# Patient Record
Sex: Female | Born: 1970 | Race: Black or African American | Hispanic: No | Marital: Single | Smoking: Never smoker
Health system: Southern US, Community
[De-identification: ages and names within clinical notes are randomized; demographics above are authoritative.]

## PROBLEM LIST (undated history)

## (undated) DIAGNOSIS — I10 Essential (primary) hypertension: Secondary | ICD-10-CM

## (undated) HISTORY — PX: TUBAL LIGATION: SHX77

---

## 1997-09-28 ENCOUNTER — Other Ambulatory Visit: Admission: RE | Admit: 1997-09-28 | Discharge: 1997-09-28 | Payer: Self-pay | Admitting: *Deleted

## 1997-09-28 ENCOUNTER — Ambulatory Visit (HOSPITAL_COMMUNITY): Admission: RE | Admit: 1997-09-28 | Discharge: 1997-09-28 | Payer: Self-pay | Admitting: *Deleted

## 1997-12-03 ENCOUNTER — Ambulatory Visit (HOSPITAL_COMMUNITY): Admission: RE | Admit: 1997-12-03 | Discharge: 1997-12-03 | Payer: Self-pay | Admitting: *Deleted

## 1998-02-03 ENCOUNTER — Inpatient Hospital Stay (HOSPITAL_COMMUNITY): Admission: AD | Admit: 1998-02-03 | Discharge: 1998-02-05 | Payer: Self-pay | Admitting: *Deleted

## 1998-05-02 ENCOUNTER — Inpatient Hospital Stay (HOSPITAL_COMMUNITY): Admission: AD | Admit: 1998-05-02 | Discharge: 1998-05-02 | Payer: Self-pay | Admitting: *Deleted

## 1999-01-14 ENCOUNTER — Encounter: Payer: Self-pay | Admitting: Emergency Medicine

## 1999-01-14 ENCOUNTER — Emergency Department (HOSPITAL_COMMUNITY): Admission: EM | Admit: 1999-01-14 | Discharge: 1999-01-14 | Payer: Self-pay | Admitting: Emergency Medicine

## 1999-07-10 ENCOUNTER — Encounter: Payer: Self-pay | Admitting: Emergency Medicine

## 1999-07-10 ENCOUNTER — Emergency Department (HOSPITAL_COMMUNITY): Admission: EM | Admit: 1999-07-10 | Discharge: 1999-07-10 | Payer: Self-pay | Admitting: Emergency Medicine

## 2000-09-22 ENCOUNTER — Encounter: Payer: Self-pay | Admitting: Emergency Medicine

## 2000-09-22 ENCOUNTER — Emergency Department (HOSPITAL_COMMUNITY): Admission: EM | Admit: 2000-09-22 | Discharge: 2000-09-22 | Payer: Self-pay | Admitting: Emergency Medicine

## 2000-10-18 ENCOUNTER — Emergency Department (HOSPITAL_COMMUNITY): Admission: EM | Admit: 2000-10-18 | Discharge: 2000-10-18 | Payer: Self-pay | Admitting: Emergency Medicine

## 2001-10-11 ENCOUNTER — Emergency Department (HOSPITAL_COMMUNITY): Admission: EM | Admit: 2001-10-11 | Discharge: 2001-10-11 | Payer: Self-pay | Admitting: Emergency Medicine

## 2001-10-11 ENCOUNTER — Encounter: Payer: Self-pay | Admitting: Emergency Medicine

## 2003-06-26 ENCOUNTER — Inpatient Hospital Stay (HOSPITAL_COMMUNITY): Admission: AD | Admit: 2003-06-26 | Discharge: 2003-07-01 | Payer: Self-pay | Admitting: Obstetrics

## 2003-06-26 ENCOUNTER — Encounter (INDEPENDENT_AMBULATORY_CARE_PROVIDER_SITE_OTHER): Payer: Self-pay | Admitting: Specialist

## 2004-03-22 ENCOUNTER — Emergency Department (HOSPITAL_COMMUNITY): Admission: EM | Admit: 2004-03-22 | Discharge: 2004-03-22 | Payer: Self-pay | Admitting: Emergency Medicine

## 2004-09-20 ENCOUNTER — Emergency Department (HOSPITAL_COMMUNITY): Admission: EM | Admit: 2004-09-20 | Discharge: 2004-09-20 | Payer: Self-pay | Admitting: Emergency Medicine

## 2004-12-31 ENCOUNTER — Inpatient Hospital Stay (HOSPITAL_COMMUNITY): Admission: AD | Admit: 2004-12-31 | Discharge: 2004-12-31 | Payer: Self-pay | Admitting: Obstetrics and Gynecology

## 2005-02-27 ENCOUNTER — Emergency Department (HOSPITAL_COMMUNITY): Admission: EM | Admit: 2005-02-27 | Discharge: 2005-02-27 | Payer: Self-pay | Admitting: *Deleted

## 2005-05-05 ENCOUNTER — Ambulatory Visit (HOSPITAL_COMMUNITY): Admission: RE | Admit: 2005-05-05 | Discharge: 2005-05-05 | Payer: Self-pay | Admitting: Obstetrics

## 2005-06-23 ENCOUNTER — Emergency Department (HOSPITAL_COMMUNITY): Admission: EM | Admit: 2005-06-23 | Discharge: 2005-06-23 | Payer: Self-pay | Admitting: Family Medicine

## 2005-07-27 ENCOUNTER — Emergency Department (HOSPITAL_COMMUNITY): Admission: EM | Admit: 2005-07-27 | Discharge: 2005-07-27 | Payer: Self-pay | Admitting: Emergency Medicine

## 2005-12-11 ENCOUNTER — Emergency Department (HOSPITAL_COMMUNITY): Admission: EM | Admit: 2005-12-11 | Discharge: 2005-12-11 | Payer: Self-pay | Admitting: Family Medicine

## 2006-07-02 ENCOUNTER — Inpatient Hospital Stay (HOSPITAL_COMMUNITY): Admission: AD | Admit: 2006-07-02 | Discharge: 2006-07-02 | Payer: Self-pay | Admitting: Obstetrics

## 2006-09-30 ENCOUNTER — Inpatient Hospital Stay (HOSPITAL_COMMUNITY): Admission: AD | Admit: 2006-09-30 | Discharge: 2006-09-30 | Payer: Self-pay | Admitting: Obstetrics

## 2007-09-29 ENCOUNTER — Inpatient Hospital Stay (HOSPITAL_COMMUNITY): Admission: AD | Admit: 2007-09-29 | Discharge: 2007-09-29 | Payer: Self-pay | Admitting: Obstetrics

## 2007-11-29 ENCOUNTER — Ambulatory Visit (HOSPITAL_COMMUNITY): Admission: RE | Admit: 2007-11-29 | Discharge: 2007-11-29 | Payer: Self-pay | Admitting: Obstetrics & Gynecology

## 2008-05-18 ENCOUNTER — Emergency Department (HOSPITAL_COMMUNITY): Admission: EM | Admit: 2008-05-18 | Discharge: 2008-05-18 | Payer: Self-pay | Admitting: Family Medicine

## 2008-06-06 ENCOUNTER — Emergency Department (HOSPITAL_COMMUNITY): Admission: EM | Admit: 2008-06-06 | Discharge: 2008-06-06 | Payer: Self-pay | Admitting: Emergency Medicine

## 2009-08-12 ENCOUNTER — Emergency Department (HOSPITAL_COMMUNITY): Admission: EM | Admit: 2009-08-12 | Discharge: 2009-08-12 | Payer: Self-pay | Admitting: Family Medicine

## 2010-10-18 NOTE — Op Note (Signed)
[name], [name]            ACCOUNT NO.:  [id number]   MEDICAL RECORD NO.:  [id number]          PATIENT TYPE:  AMB   LOCATION:  SDC                           FACILITY:  [facility]   PHYSICIAN:  Roseanna Rainbow, M.D.DATE OF BIRTH:  01-14-1971   DATE OF PROCEDURE:  11/29/2007  DATE OF DISCHARGE:  09/29/2007                               OPERATIVE REPORT   PREOPERATIVE DIAGNOSIS:  Desires sterilization procedure.   POSTOPERATIVE DIAGNOSIS:  Desires sterilization procedure.   PROCEDURE:  Essure and laparoscopic unilateral tubal ligation.   SURGEON:  Roseanna Rainbow, MD.   ANESTHESIA:  General with monitored anesthesia care and paracervical  block.   ESTIMATED BLOOD LOSS:  Minimal.   COMPLICATIONS:  None.   PROCEDURE:  The patient was taken to the operating room with an IV  running.  She was placed in the dorsal lithotomy position and prepped  and draped in the usual sterile fashion.  A bivalve speculum was placed  in the patient's vagina after a time-out had been completed.  The  anterior lip of the cervix was infiltrated with 2 mL 1% Xylocaine plain.  The single-tooth tenaculum was then applied to this location.  Four mL  of 1% Xylocaine plain was infiltrated at 4 o'clock and 7 o'clock to  produce a paracervical block.  The cervix was then dilated with [facility]  dilators.  The hysteroscope was then advanced into the uterus.  Upon  hysteroscopic survey of the cornu, the left tubal ostia was visualized  and it was felt that the right tubal ostia was visualized as well;  however, there was a filmy membrane covering the ostium.  The left  ostium was then cannulated with the Essure device and placed.  After  placement, four portals were noted distal to the ostium.  Attempts were  made to cannulate the right tubal ostium; however, this appears to be  difficult and further efforts were aborted.  At that point, the decision  was made to proceed with laparoscopy.  All the  instruments were removed  from the vagina.  The abdomen and vagina were then reprepped and draped.  General anesthesia was then given.  An infraumbilical skin incision was  then made with the scalpel.  Using the Optiview, a trocar and sleeve  were then advanced into the abdomen.  The abdomen was then infiltrated  with CO2 gas.  Survey of the pelvic viscera revealed normal findings.  A  2-cm segment of the mid isthmic portion of tube was then contiguously  cauterized using the bipolar instrument.  With each application, the  ohmmeter was noted for 0.  All the instruments were then removed from  the abdomen.  The skin was closed with interrupted sutures of 4-0 Vicryl  and Dermabond.  Prior to the laparoscopic portion of the case, a Hulka  manipulator had been advanced into  the uterus and secured to the anterior lip of the cervix to manipulate  the uterus, and this was removed at the end of the case with minimal  bleeding noted.  At the close of the procedure, the instrument and pack  counts were said to be correct x2.  The patient was taken to the PACU  awake and in stable condition.      Roseanna Rainbow, M.D.  Electronically Signed     LAJ/MEDQ  D:  11/29/2007  T:  11/30/2007  Job:  [id number]

## 2010-10-21 NOTE — Op Note (Signed)
[name], [name]                        ACCOUNT NO.:  [id number]   MEDICAL RECORD NO.:  [id number]                   PATIENT TYPE:  INP   LOCATION:  9199                                 FACILITY:  [facility]   PHYSICIAN:  Kathreen Cosier, M.D.           DATE OF BIRTH:  [birth year]   DATE OF PROCEDURE:  06/26/2003  DATE OF DISCHARGE:                                 OPERATIVE REPORT   PREOPERATIVE DIAGNOSIS:  Abruption of the placenta at 29 weeks.   ANESTHESIA:  General.   PROCEDURE:  Patient placed on the operating room table in supine position,  abdomen prepped and draped.  Bladder emptied with a Foley catheter.  A  transverse suprapubic incision made, carried out to the rectus fascia, the  fascia cleaned and incised the length of the incision.  Recti muscles  retracted laterally, peritoneum incised longitudinally.  A transverse  incision made in the visceral peritoneum above the bladder and the bladder  mobilized inferiorly.  A transverse lower uterine incision was made and  delivery effected at 2:11 p.m.  There was a female, Apgar 3 and 8.  The uterus  was full of blood.  There was greater than an 80% abruption of the placenta.  The uterus was also becoming a [place] uterus.  The uterine cavity  cleaned with dry laps.  The uterine incision closed in one layer with  continuous suture of #1 chromic including myometrium and endometrium.  Bladder flap reattached with 2-0 chromic.  The tubes and ovaries were  normal.  Abdomen closed in layers, the peritoneum with a continuous suture  of 0 chromic, the fascia with continuous suture of 0 Dexon, and the skin  closed with subcuticular stitch of 4-0 Monocryl.  Cord pH was 6.92 and the  baby weighed 3 pounds 1 ounce.  Blood loss was approximately 700 mL.                                               Kathreen Cosier, M.D.    BAM/MEDQ  D:  06/26/2003  T:  06/27/2003  Job:  [id number]

## 2010-10-21 NOTE — Discharge Summary (Signed)
[name], [name]                        ACCOUNT NO.:  [id number]   MEDICAL RECORD NO.:  [id number]                   PATIENT TYPE:  INP   LOCATION:  9307                                 FACILITY:  [facility]   PHYSICIAN:  Kathreen Cosier, M.D.           DATE OF BIRTH:  [birth year]   DATE OF ADMISSION:  06/26/2003  DATE OF DISCHARGE:  07/01/2003                                 DISCHARGE SUMMARY   The patient is a 40 year old, gravida 5, para 3-1-1-4, whose EDC was September 05, 2003, by recent ultrasound. The patient was first seen in September and  made a total of two office visits. She was last seen on October 15th in my  office. Since then there has been no contact. The patient was admitted with  sharp lower abdominal pain which began at 11:30 a.m. and she came to the  hospital at 1:30 p.m. On admission her blood pressure was 147/105, cervix 2  cm, and the uterus was rigid. Fetal heart rate was between 116 and 120.  Hemoglobin was 9.6, platelet count 155,000.  With a diagnosis of hidden  abruption, she was taken to the operating room and primary low transverse  Cesarean section was performed.  She had a female, Apgar of 3 and 6, placenta  was greater than 80% abrupted. Blood loss was at least 700 cc. Tubal  ligation was not performed. Cord pH was 6.92. It was noted when the patient  was in the recovery room lochia was heavier than normal. She was given  methadone 0.2 IM, but still was bleeding. So Hemabate 250 mcg was given. At  5:45 p.m., her hemoglobin was 6.4, platelet count 131,000. Her DIC panel  showed her PT at 18.5, INR 9.1, PTT 35, platelet count 135,000. She  continued bleeding. She got two units of fresh frozen plasma and a second  dose of Humibid IM, and Cytotec 800 mcg was given per rectum. She was  started on transfusion.  After receiving the Cytotec, Humibid, and the fresh  frozen plasma, her bleeding improved, and she was transferred to the ICU.  On June 27, 2003, her  blood pressure was 110/60, pulse 90.  Hemoglobin  8.9 after three units of packed cells. Her output had improved after given  Lasix 40 mg. On the afternoon of June 26, 2002, her output decreased and  a central line was placed by anesthesia. Initial CVP was 5-6. She was then  hydrated with D-5 ringers Lactate at 125 cc/hour, and her output improved.  Chest x-ray was normal. She was given an additional two units of packed  cells for a  total of five units of packed cells.  By the second  postoperative day, June 28, 2003, blood pressure was 150/80, pulse 100,  oxygen saturation 94%. Chest x-ray was negative. Hemoglobin 7.9 after five  units, white count 21.8, platelet count 70,000. D-dimer 4.7, fibrinogen 480;  up from 78 initially and  1.3. Outpatient became good. Her potassium was 3.4.  She was placed on K-Dur. Her incision was clean and dry. By the third  postoperative day, platelet count was 89,000, DIC panel normal, hemoglobin  7.4, abdomen soft, incision clean. The CVP was discontinued and the patient  was transferred over to ICU. After transfer, her blood pressure ranged  between 140 to 91 over 60 to 100.  Digoxin level was negative. She was  started on hydrochlorothiazide on day four and labetalol 100 mg p.o. b.i.d.  By day five, blood pressure ranged between 140 to 150 over 90 to 100.  Hemoglobin was 7.3. Digoxin level was negative.  It was decided she could be  discharged.  She felt fine, had no complaints. Page instructions were given  to the patient and her uncle to see Dr. Shana Chute within three days of  discharge. The telephone number of Dr. Shana Chute is given for follow up of her  blood pressure. She is given ferrous sulfate 325 mg p.o. b.i.d.,  hydrochlorothiazide 50 m p.o. daily, labetalol 100 mg p.o. b.i.d., Tylenol  #3 for pain, and to see me in six weeks and Dr. Shana Chute in three days.   DISCHARGE DIAGNOSES:  1. Status post limited prenatal care.  2. Abruption of the  placenta at 29 weeks.                                               Kathreen Cosier, M.D.   BAM/MEDQ  D:  07/01/2003  T:  07/01/2003  Job:  [id number]

## 2010-10-21 NOTE — H&P (Signed)
[name], [name]                        ACCOUNT NO.:  [id number]   MEDICAL RECORD NO.:  [id number]                   PATIENT TYPE:  INP   LOCATION:  9199                                 FACILITY:  [facility]   PHYSICIAN:  Kathreen Cosier, M.D.           DATE OF BIRTH:  11-22-1970   DATE OF ADMISSION:  06/26/2003  DATE OF DISCHARGE:                                HISTORY & PHYSICAL   The patient is a 40 year old gravida 5 para 3-1-1-4, [facility] April 2005, came to  the hospital with a sudden onset of sharp abdominal pain which began at  11:30 a.m. the day of admission and the pain was unremitting and got worse.  The patient arrived in triage at 1:30 p.m.  She was placed on a monitor at  1:32.  Because of difficulty finding the heart beat, the nurse called and  said that the patient was having abruption.  Her abdomen was rigid.  I was  in the hospital.  I went and saw the patient and confirmed the diagnosis at  approximately 1:34 p.m.  C-section was called and the cervix was 2-cm.  Arrived in the OR at 1:55 p.m.  A quick spinal was done but it did not work.  Fetal heart was 99.  After the spinal general anesthesia was administered  for surgery.   PHYSICAL EXAMINATION:  GENERAL:  Revealed a well developed female in no  distress complaining of severe abdominal pain.  HEENT:  Negative.  LUNGS:  Clear.  HEART:  Regular rhythm.  No murmurs, no gallops.  ABDOMEN:  Is 30 week size and rock hard and tender with no relaxation.  PELVIC:  Cervix was 2-cm __________ .  EXTREMITIES:  Negative.                                               Kathreen Cosier, M.D.    BAM/MEDQ  D:  06/26/2003  T:  06/27/2003  Job:  [id number]

## 2011-02-28 LAB — HCG, QUANTITATIVE, PREGNANCY: hCG, Beta Chain, Quant, S: 2580 — ABNORMAL HIGH

## 2011-02-28 LAB — CBC
HCT: 32.5 — ABNORMAL LOW
Hemoglobin: 11.3 — ABNORMAL LOW
MCHC: 34.9
MCV: 99.2
RBC: 3.28 — ABNORMAL LOW

## 2011-02-28 LAB — GC/CHLAMYDIA PROBE AMP, GENITAL
Chlamydia, DNA Probe: NEGATIVE
GC Probe Amp, Genital: NEGATIVE

## 2011-02-28 LAB — WET PREP, GENITAL: Trich, Wet Prep: NONE SEEN

## 2011-03-02 LAB — BASIC METABOLIC PANEL
Chloride: 106
Creatinine, Ser: 0.96
GFR calc Af Amer: >60
Glucose, Bld: 101 — ABNORMAL HIGH
Potassium: 3.6
Sodium: 137

## 2011-03-02 LAB — CBC
MCHC: 33.8
RBC: 3.7 — ABNORMAL LOW
WBC: 7.7

## 2011-03-02 LAB — PREGNANCY, URINE: Preg Test, Ur: NEGATIVE

## 2011-04-29 ENCOUNTER — Emergency Department (HOSPITAL_COMMUNITY)
Admission: EM | Admit: 2011-04-29 | Discharge: 2011-04-29 | Disposition: A | Discharge status: Home / Self Care | Payer: Self-pay | Attending: Emergency Medicine | Admitting: Emergency Medicine

## 2011-04-29 ENCOUNTER — Encounter: Payer: Self-pay | Admitting: Nurse Practitioner

## 2011-04-29 ENCOUNTER — Emergency Department (HOSPITAL_COMMUNITY): Payer: Self-pay

## 2011-04-29 DIAGNOSIS — I10 Essential (primary) hypertension: Secondary | ICD-10-CM | POA: Insufficient documentation

## 2011-04-29 DIAGNOSIS — R059 Cough, unspecified: Secondary | ICD-10-CM | POA: Insufficient documentation

## 2011-04-29 DIAGNOSIS — R197 Diarrhea, unspecified: Secondary | ICD-10-CM | POA: Insufficient documentation

## 2011-04-29 DIAGNOSIS — R05 Cough: Secondary | ICD-10-CM

## 2011-04-29 DIAGNOSIS — R110 Nausea: Secondary | ICD-10-CM | POA: Insufficient documentation

## 2011-04-29 DIAGNOSIS — Z79899 Other long term (current) drug therapy: Secondary | ICD-10-CM | POA: Insufficient documentation

## 2011-04-29 DIAGNOSIS — IMO0001 Reserved for inherently not codable concepts without codable children: Secondary | ICD-10-CM | POA: Insufficient documentation

## 2011-04-29 DIAGNOSIS — M546 Pain in thoracic spine: Secondary | ICD-10-CM | POA: Insufficient documentation

## 2011-04-29 DIAGNOSIS — R109 Unspecified abdominal pain: Secondary | ICD-10-CM | POA: Insufficient documentation

## 2011-04-29 DIAGNOSIS — R6889 Other general symptoms and signs: Secondary | ICD-10-CM | POA: Insufficient documentation

## 2011-04-29 HISTORY — DX: Essential (primary) hypertension: I10

## 2011-04-29 LAB — DIFFERENTIAL
Basophils Absolute: 0 10*3/uL (ref 0.0–0.1)
Eosinophils Absolute: 0.1 10*3/uL (ref 0.0–0.7)
Lymphocytes Relative: 29 % (ref 12–46)
Monocytes Absolute: 0.7 10*3/uL (ref 0.1–1.0)
Monocytes Relative: 10 % (ref 3–12)
Neutrophils Relative %: 60 % (ref 43–77)

## 2011-04-29 LAB — URINALYSIS, ROUTINE W REFLEX MICROSCOPIC
Bilirubin Urine: NEGATIVE
Ketones, ur: NEGATIVE mg/dL
Nitrite: NEGATIVE
Protein, ur: >300 mg/dL — AB
Urobilinogen, UA: 0.2 mg/dL (ref 0.0–1.0)

## 2011-04-29 LAB — CBC
HCT: 38.4 % (ref 36.0–46.0)
MCV: 97.2 fL (ref 78.0–100.0)
Platelets: 228 10*3/uL (ref 150–400)
RDW: 13.2 % (ref 11.5–15.5)

## 2011-04-29 LAB — URINE MICROSCOPIC-ADD ON

## 2011-04-29 LAB — POCT PREGNANCY, URINE: Preg Test, Ur: NEGATIVE

## 2011-04-29 LAB — COMPREHENSIVE METABOLIC PANEL
ALT: 14 U/L (ref 0–35)
BUN: 8 mg/dL (ref 6–23)
CO2: 26 mEq/L (ref 19–32)
Creatinine, Ser: 0.84 mg/dL (ref 0.50–1.10)
GFR calc Af Amer: >90 mL/min (ref >90)
GFR calc non Af Amer: 85 mL/min — ABNORMAL LOW (ref >90)
Glucose, Bld: 100 mg/dL — ABNORMAL HIGH (ref 70–99)
Sodium: 138 mEq/L (ref 135–145)
Total Bilirubin: 0.1 mg/dL — ABNORMAL LOW (ref 0.3–1.2)
Total Protein: 7.9 g/dL (ref 6.0–8.3)

## 2011-04-29 LAB — LIPASE, BLOOD: Lipase: 32 U/L (ref 11–59)

## 2011-04-29 MED ORDER — BENAZEPRIL-HYDROCHLOROTHIAZIDE 10-12.5 MG PO TABS: 1.0000 | ORAL_TABLET | Freq: Every day | ORAL | Status: DC

## 2011-04-29 MED ORDER — POTASSIUM CHLORIDE 20 MEQ/15ML (10%) PO LIQD
60.0000 meq | Freq: Once | ORAL | Status: AC
  Administered 2011-04-29: 60 meq via ORAL
  Filled 2011-04-29: qty 45

## 2011-04-29 MED ORDER — KETOROLAC TROMETHAMINE 30 MG/ML IJ SOLN
30.0000 mg | Freq: Once | INTRAMUSCULAR | Status: AC
  Administered 2011-04-29: 30 mg via INTRAMUSCULAR
  Filled 2011-04-29: qty 1

## 2011-04-29 NOTE — ED Provider Notes (Addendum)
History     CSN: [id number] Arrival date & time: 04/29/2011  4:33 PM   First MD Initiated Contact with Patient 04/29/11 1726      Chief Complaint  Patient presents with  . Flank Pain     HPI  40 -year-old female history of hypertension noncompliant with medication presents with multiple complaints. The patient complains of left flank, upper back pain, diffuse body aches, nausea for the past 4 days. She is experiencing productive cough with green sputum. Feels like she has pneumonia. She states that 5 days ago she had diarrhea. She's not had diarrhea since then. She denies any blood in her stool. She denies vomiting. No fevers, chills. She has a mild runny nose. No sick contacts. She denies shortness of breath. She denies any difficulty with urination. She states she is not taking any medication for her hypertension in several weeks.    Denies h/o VTE in self or family. No recent hosp/surg/immob. No h/o cancer. Denies exogenous hormone use, no leg pain or swelling Denies hematuria/dysuria/freq/urgency Denies vaginal discharge.    Past Medical History  Diagnosis Date  . Hypertension     History reviewed. No pertinent past surgical history.  History reviewed. No pertinent family history.  History  Substance Use Topics  . Smoking status: Never Smoker   . Smokeless tobacco: Not on file  . Alcohol Use: No    OB History    Grav Para Term Preterm Abortions TAB SAB Ect Mult Living                  Review of Systems  All other systems reviewed and are negative.   except as noted HPI  Allergies  Review of patient's allergies indicates no known allergies.  Home Medications   Current Outpatient Rx  Name Route Sig Dispense Refill  . THERA M PLUS PO TABS Oral Take 1 tablet by mouth daily.      Marland Kitchen PHENIRAMINE-PE-APAP 20-10-650 MG PO PACK Oral Take 1 packet by mouth every 6 (six) hours as needed.      Marland Kitchen BENAZEPRIL-HYDROCHLOROTHIAZIDE 10-12.5 MG PO TABS Oral Take 1 tablet by  mouth daily. 30 tablet 0   04/29/11 1637 98.8 (37.1) 100 16 ! 142/110 98 146 lb (66.225 kg) ASR  BP 142/106  Pulse 90  Temp(Src) 98.5 F (36.9 C) (Oral)  Resp 14  Ht 5\' 6"  (1.676 m)  Wt 146 lb (66.225 kg)  BMI 23.56 kg/m2  SpO2 100%  Physical Exam  Nursing note and vitals reviewed. Constitutional: She is oriented to person, place, and time. She appears well-developed.  HENT:  Head: Atraumatic.  Mouth/Throat: Oropharynx is clear and moist.  Eyes: Conjunctivae and EOM are normal. Pupils are equal, round, and reactive to light.  Neck: Normal range of motion. Neck supple.  Cardiovascular: Normal rate, regular rhythm, normal heart sounds and intact distal pulses.   Pulmonary/Chest: Effort normal and breath sounds normal. No respiratory distress. She has no wheezes. She has no rales.       L upper thoracic paraspinal ttp  Abdominal: Soft. She exhibits no distension. There is no tenderness. There is no rebound and no guarding.       No cvat  Musculoskeletal: Normal range of motion.  Neurological: She is alert and oriented to person, place, and time.  Skin: Skin is warm and dry. No rash noted.  Psychiatric: She has a normal mood and affect.    ED Course  Procedures (including critical care time)  Labs  Reviewed  URINALYSIS, ROUTINE W REFLEX MICROSCOPIC - Abnormal; Notable for the following:    Appearance CLOUDY (*)    Hgb urine dipstick MODERATE (*)    Protein, ur >300 (*)    All other components within normal limits  COMPREHENSIVE METABOLIC PANEL - Abnormal; Notable for the following:    Potassium 2.9 (*)    Glucose, Bld 100 (*)    Albumin 3.4 (*)    Total Bilirubin 0.1 (*)    GFR calc non Af Amer 85 (*)    All other components within normal limits  URINE MICROSCOPIC-ADD ON - Abnormal; Notable for the following:    Squamous Epithelial / LPF MANY (*)    All other components within normal limits  POCT PREGNANCY, URINE  CBC  DIFFERENTIAL  LIPASE, BLOOD   Dg Chest 2  View  04/29/2011  *RADIOLOGY REPORT*  Clinical Data: Cough.  Fever.  CHEST - 2 VIEW  Comparison: None.  Findings:  Cardiopericardial silhouette within normal limits. Mediastinal contours normal. Trachea midline.  No airspace disease or effusion.  IMPRESSION: No active cardiopulmonary disease.  Original Report Authenticated By: Andreas Newport, M.D.     1. Flank pain   2. Diarrhea   3. Cough   4. Hypertension      MDM  Multiple complaints. Pt with 0/10 pain after Toradol. She states that she is willing to take antihypertensives cannot remember her previous medication. She does have some protein in her urine and hematuria but her renal function is normal. Her pain is not intermittent/colicky or c/w renal stone. Her sx are mostly URI/viral syndrome. She is aware that her blood pressure is elevated today. She is given a primary care referral and she will follow for recheck of her blood pressure in 2 days. Given strict precautions for return. She would like to be discharged home.    Forbes Cellar, MD 04/29/11 1610  Forbes Cellar, MD 04/29/11 1958

## 2011-04-29 NOTE — ED Notes (Signed)
C/o flank and upper back pain, body aches, nausea, diarrhea and productive cough over past 4 days. A&Ox4, resp e/u

## 2011-04-29 NOTE — ED Notes (Signed)
Discharge instructions reviewed with Pt;  Verbalizes understanding.  No questions asked ; no further c/o's voiced.  Pt ambulatory to lobby.  NAD noted.  VSS.

## 2011-08-20 ENCOUNTER — Emergency Department (HOSPITAL_COMMUNITY)
Admission: EM | Admit: 2011-08-20 | Discharge: 2011-08-21 | Disposition: A | Discharge status: Home / Self Care | Payer: Self-pay | Attending: Emergency Medicine | Admitting: Emergency Medicine

## 2011-08-20 ENCOUNTER — Encounter (HOSPITAL_COMMUNITY): Payer: Self-pay | Admitting: *Deleted

## 2011-08-20 DIAGNOSIS — M545 Low back pain, unspecified: Secondary | ICD-10-CM | POA: Insufficient documentation

## 2011-08-20 DIAGNOSIS — S39012A Strain of muscle, fascia and tendon of lower back, initial encounter: Secondary | ICD-10-CM

## 2011-08-20 DIAGNOSIS — S139XXA Sprain of joints and ligaments of unspecified parts of neck, initial encounter: Secondary | ICD-10-CM | POA: Insufficient documentation

## 2011-08-20 DIAGNOSIS — Y92410 Unspecified street and highway as the place of occurrence of the external cause: Secondary | ICD-10-CM | POA: Insufficient documentation

## 2011-08-20 DIAGNOSIS — S161XXA Strain of muscle, fascia and tendon at neck level, initial encounter: Secondary | ICD-10-CM

## 2011-08-20 DIAGNOSIS — S339XXA Sprain of unspecified parts of lumbar spine and pelvis, initial encounter: Secondary | ICD-10-CM | POA: Insufficient documentation

## 2011-08-20 DIAGNOSIS — I10 Essential (primary) hypertension: Secondary | ICD-10-CM | POA: Insufficient documentation
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
## 2011-08-21 ENCOUNTER — Emergency Department (HOSPITAL_COMMUNITY): Payer: Self-pay

## 2011-08-21 LAB — POCT PREGNANCY, URINE: Preg Test, Ur: NEGATIVE

## 2011-08-21 MED ORDER — HYDROCODONE-ACETAMINOPHEN 5-325 MG PO TABS: 1.0000 | ORAL_TABLET | ORAL | Status: AC | PRN

## 2011-08-21 MED ORDER — DIAZEPAM 5 MG PO TABS: 5.0000 mg | ORAL_TABLET | Freq: Three times a day (TID) | ORAL | Status: AC | PRN

## 2011-08-21 MED ORDER — IBUPROFEN 800 MG PO TABS
800.0000 mg | ORAL_TABLET | Freq: Once | ORAL | Status: AC
  Administered 2011-08-21: 800 mg via ORAL
  Filled 2011-08-21: qty 1

## 2011-08-21 MED ORDER — IBUPROFEN 400 MG PO TABS: 400.0000 mg | ORAL_TABLET | Freq: Four times a day (QID) | ORAL | Status: AC | PRN
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
## 2011-08-21 NOTE — ED Notes (Signed)
Returned from Radiology.  Denies Numbness or tingling in extremeties, denies urinary/fecal incontinence.
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
## 2011-08-21 NOTE — ED Provider Notes (Signed)
Medical screening examination/treatment/procedure(s) were performed by non-physician practitioner and as supervising physician I was immediately available for consultation/collaboration.   Joya Gaskins, MD 08/21/11 [phone]

## 2011-08-21 NOTE — ED Notes (Signed)
Transported to Radiology.  Snacks and drink given to son
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
## 2013-04-14 ENCOUNTER — Observation Stay (HOSPITAL_COMMUNITY)
Admission: EM | Admit: 2013-04-14 | Discharge: 2013-04-15 | Disposition: A | Discharge status: Home / Self Care | Payer: No Typology Code available for payment source | Attending: Internal Medicine | Admitting: Internal Medicine

## 2013-04-14 ENCOUNTER — Emergency Department (HOSPITAL_COMMUNITY): Payer: No Typology Code available for payment source

## 2013-04-14 ENCOUNTER — Encounter (HOSPITAL_COMMUNITY): Payer: Self-pay | Admitting: Emergency Medicine

## 2013-04-14 DIAGNOSIS — Z23 Encounter for immunization: Secondary | ICD-10-CM | POA: Insufficient documentation

## 2013-04-14 DIAGNOSIS — I160 Hypertensive urgency: Secondary | ICD-10-CM

## 2013-04-14 DIAGNOSIS — I10 Essential (primary) hypertension: Secondary | ICD-10-CM | POA: Diagnosis present

## 2013-04-14 DIAGNOSIS — Y92410 Unspecified street and highway as the place of occurrence of the external cause: Secondary | ICD-10-CM | POA: Insufficient documentation

## 2013-04-14 DIAGNOSIS — Y9389 Activity, other specified: Secondary | ICD-10-CM | POA: Insufficient documentation

## 2013-04-14 DIAGNOSIS — Z79899 Other long term (current) drug therapy: Secondary | ICD-10-CM | POA: Insufficient documentation

## 2013-04-14 DIAGNOSIS — Z3202 Encounter for pregnancy test, result negative: Secondary | ICD-10-CM | POA: Insufficient documentation

## 2013-04-14 DIAGNOSIS — R0602 Shortness of breath: Secondary | ICD-10-CM | POA: Insufficient documentation

## 2013-04-14 DIAGNOSIS — M542 Cervicalgia: Secondary | ICD-10-CM

## 2013-04-14 DIAGNOSIS — S0993XA Unspecified injury of face, initial encounter: Principal | ICD-10-CM | POA: Insufficient documentation

## 2013-04-14 LAB — CBC WITH DIFFERENTIAL/PLATELET
Basophils Absolute: 0 10*3/uL (ref 0.0–0.1)
Basophils Relative: 0 % (ref 0–1)
Eosinophils Absolute: 0.6 10*3/uL (ref 0.0–0.7)
HCT: 36 % (ref 36.0–46.0)
Hemoglobin: 12.1 g/dL (ref 12.0–15.0)
MCH: 33.1 pg (ref 26.0–34.0)
MCHC: 33.6 g/dL (ref 30.0–36.0)
Monocytes Absolute: 0.5 10*3/uL (ref 0.1–1.0)
Monocytes Relative: 7 % (ref 3–12)
Neutro Abs: 2.1 10*3/uL (ref 1.7–7.7)
RDW: 13.3 % (ref 11.5–15.5)

## 2013-04-14 LAB — URINALYSIS, ROUTINE W REFLEX MICROSCOPIC
Bilirubin Urine: NEGATIVE
Ketones, ur: NEGATIVE mg/dL
Leukocytes, UA: NEGATIVE
Nitrite: NEGATIVE
Protein, ur: 30 mg/dL — AB
Urobilinogen, UA: 0.2 mg/dL (ref 0.0–1.0)

## 2013-04-14 LAB — BASIC METABOLIC PANEL
BUN: 18 mg/dL (ref 6–23)
Calcium: 9.5 mg/dL (ref 8.4–10.5)
Creatinine, Ser: 0.97 mg/dL (ref 0.50–1.10)
GFR calc Af Amer: 82 mL/min — ABNORMAL LOW (ref >90)
GFR calc non Af Amer: 71 mL/min — ABNORMAL LOW (ref >90)

## 2013-04-14 LAB — URINE MICROSCOPIC-ADD ON

## 2013-04-14 MED ORDER — AMLODIPINE BESYLATE 2.5 MG PO TABS: 2.5000 mg | ORAL_TABLET | Freq: Every day | ORAL | Status: DC

## 2013-04-14 MED ORDER — CLONIDINE HCL 0.1 MG PO TABS
0.1000 mg | ORAL_TABLET | Freq: Once | ORAL | Status: AC
  Administered 2013-04-14: 0.1 mg via ORAL
  Filled 2013-04-14: qty 1

## 2013-04-14 MED ORDER — HYDROCHLOROTHIAZIDE 12.5 MG PO TABS: 12.5000 mg | ORAL_TABLET | Freq: Every day | ORAL | Status: DC

## 2013-04-14 MED ORDER — HYDROCODONE-ACETAMINOPHEN 5-325 MG PO TABS
2.0000 | ORAL_TABLET | Freq: Once | ORAL | Status: AC
  Administered 2013-04-14: 2 via ORAL
  Filled 2013-04-14: qty 2

## 2013-04-14 MED ORDER — HYDROCODONE-ACETAMINOPHEN 5-325 MG PO TABS: ORAL_TABLET | ORAL | Status: DC
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
## 2013-04-15 ENCOUNTER — Encounter (HOSPITAL_COMMUNITY): Payer: Self-pay | Admitting: Internal Medicine

## 2013-04-15 DIAGNOSIS — I10 Essential (primary) hypertension: Secondary | ICD-10-CM

## 2013-04-15 DIAGNOSIS — M542 Cervicalgia: Secondary | ICD-10-CM

## 2013-04-15 LAB — CBC
HCT: 37.3 % (ref 36.0–46.0)
Hemoglobin: 12.6 g/dL (ref 12.0–15.0)
MCH: 33.2 pg (ref 26.0–34.0)
MCHC: 33.8 g/dL (ref 30.0–36.0)
MCV: 98.4 fL (ref 78.0–100.0)
Platelets: 267 K/uL (ref 150–400)
RBC: 3.79 MIL/uL — ABNORMAL LOW (ref 3.87–5.11)
RDW: 13.4 % (ref 11.5–15.5)
WBC: 6.4 K/uL (ref 4.0–10.5)

## 2013-04-15 LAB — BASIC METABOLIC PANEL
BUN: 16 mg/dL (ref 6–23)
Calcium: 9.3 mg/dL (ref 8.4–10.5)
Chloride: 104 mEq/L (ref 96–112)
Creatinine, Ser: 1.05 mg/dL (ref 0.50–1.10)
GFR calc Af Amer: 74 mL/min — ABNORMAL LOW (ref >90)
Glucose, Bld: 101 mg/dL — ABNORMAL HIGH (ref 70–99)
Potassium: 3.3 mEq/L — ABNORMAL LOW (ref 3.5–5.1)

## 2013-04-15 MED ORDER — HYDROCODONE-ACETAMINOPHEN 5-325 MG PO TABS: 1.0000 | ORAL_TABLET | ORAL | Status: DC | PRN

## 2013-04-15 MED ORDER — INFLUENZA VAC SPLIT QUAD 0.5 ML IM SUSP: 0.5000 mL | INTRAMUSCULAR | Status: DC

## 2013-04-15 MED ORDER — INFLUENZA VAC SPLIT QUAD 0.5 ML IM SUSP
0.5000 mL | INTRAMUSCULAR | Status: AC
  Administered 2013-04-15: 0.5 mL via INTRAMUSCULAR
  Filled 2013-04-15: qty 0.5

## 2013-04-15 MED ORDER — CYCLOBENZAPRINE HCL 10 MG PO TABS: 10.0000 mg | ORAL_TABLET | Freq: Three times a day (TID) | ORAL | Status: DC | PRN

## 2013-04-15 MED ORDER — HYDRALAZINE HCL 20 MG/ML IJ SOLN
10.0000 mg | Freq: Once | INTRAMUSCULAR | Status: AC
  Administered 2013-04-15: 10 mg via INTRAVENOUS
  Filled 2013-04-15: qty 0.5

## 2013-04-15 MED ORDER — BENAZEPRIL-HYDROCHLOROTHIAZIDE 10-12.5 MG PO TABS: 1.0000 | ORAL_TABLET | Freq: Every day | ORAL | Status: DC

## 2013-04-15 MED ORDER — ACETAMINOPHEN 650 MG RE SUPP: 650.0000 mg | Freq: Four times a day (QID) | RECTAL | Status: DC | PRN

## 2013-04-15 MED ORDER — POTASSIUM CHLORIDE CRYS ER 20 MEQ PO TBCR
40.0000 meq | EXTENDED_RELEASE_TABLET | Freq: Once | ORAL | Status: AC
  Administered 2013-04-15: 40 meq via ORAL
  Filled 2013-04-15: qty 2

## 2013-04-15 MED ORDER — HYDRALAZINE HCL 20 MG/ML IJ SOLN: 10.0000 mg | INTRAMUSCULAR | Status: DC | PRN

## 2013-04-15 MED ORDER — IBUPROFEN 800 MG PO TABS
800.0000 mg | ORAL_TABLET | Freq: Three times a day (TID) | ORAL | Status: DC
  Administered 2013-04-15 (×2): 800 mg via ORAL
  Filled 2013-04-15 (×4): qty 1

## 2013-04-15 MED ORDER — HYDROCHLOROTHIAZIDE 12.5 MG PO CAPS
12.5000 mg | ORAL_CAPSULE | Freq: Every day | ORAL | Status: DC
  Administered 2013-04-15: 12.5 mg via ORAL
  Filled 2013-04-15: qty 1

## 2013-04-15 MED ORDER — LISINOPRIL-HYDROCHLOROTHIAZIDE 20-12.5 MG PO TABS: 1.0000 | ORAL_TABLET | Freq: Every day | ORAL | Status: DC

## 2013-04-15 MED ORDER — SODIUM CHLORIDE 0.9 % IJ SOLN: 3.0000 mL | Freq: Two times a day (BID) | INTRAMUSCULAR | Status: DC

## 2013-04-15 MED ORDER — ACETAMINOPHEN 325 MG PO TABS: 650.0000 mg | ORAL_TABLET | Freq: Four times a day (QID) | ORAL | Status: DC | PRN

## 2013-04-15 MED ORDER — ONDANSETRON HCL 4 MG/2ML IJ SOLN: 4.0000 mg | Freq: Three times a day (TID) | INTRAMUSCULAR | Status: DC | PRN

## 2013-04-15 MED ORDER — CYCLOBENZAPRINE HCL 10 MG PO TABS
10.0000 mg | ORAL_TABLET | Freq: Three times a day (TID) | ORAL | Status: DC | PRN
  Filled 2013-04-15: qty 1

## 2013-04-15 MED ORDER — ONDANSETRON HCL 4 MG/2ML IJ SOLN: 4.0000 mg | Freq: Four times a day (QID) | INTRAMUSCULAR | Status: DC | PRN

## 2013-04-15 MED ORDER — BENAZEPRIL HCL 10 MG PO TABS
10.0000 mg | ORAL_TABLET | Freq: Every day | ORAL | Status: DC
  Administered 2013-04-15: 10 mg via ORAL
  Filled 2013-04-15: qty 1

## 2013-04-15 MED ORDER — ONDANSETRON HCL 4 MG PO TABS: 4.0000 mg | ORAL_TABLET | Freq: Four times a day (QID) | ORAL | Status: DC | PRN

## 2013-04-15 MED ORDER — HYDRALAZINE HCL 20 MG/ML IJ SOLN
2.0000 mg | Freq: Once | INTRAMUSCULAR | Status: DC
  Filled 2013-04-15: qty 0.1
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
## 2013-04-15 NOTE — ED Notes (Addendum)
PA notified of d/c vitals, high bp

## 2013-04-15 NOTE — Care Management (Addendum)
1133 04-15-13 Pt has f/u appointment scheduled 04-22-13 @ 2pm. Pt will f/u with orange card on same day for medication assistance. She has the eligibility paperwork and is aware of items needed for appointment.  Pt will be d/c on $4.00 meds that can be purchased at [facility]. No furthter needs from CM at this time. Gala Lewandowsky, RN,BSN [phone]

## 2013-04-16 ENCOUNTER — Telehealth (HOSPITAL_BASED_OUTPATIENT_CLINIC_OR_DEPARTMENT_OTHER): Payer: Self-pay

## 2013-04-17 NOTE — ED Provider Notes (Signed)
Medical screening examination/treatment/procedure(s) were performed by non-physician practitioner and as supervising physician I was immediately available for consultation/collaboration.  EKG Interpretation     Ventricular Rate:  61 PR Interval:  177 QRS Duration: 83 QT Interval:  464 QTC Calculation: 467 R Axis:   33 Text Interpretation:  Sinus rhythm Probable left atrial enlargement             Harrold Donath R. Rubin Payor, MD 04/17/13 [phone]

## 2013-04-22 ENCOUNTER — Ambulatory Visit: Payer: Self-pay | Attending: Internal Medicine | Admitting: Internal Medicine

## 2013-04-22 ENCOUNTER — Encounter: Payer: Self-pay | Admitting: Internal Medicine

## 2013-04-22 VITALS — BP 168/113 | HR 79 | Temp 98.9°F | Ht 67.0 in | Wt 142.0 lb

## 2013-04-22 DIAGNOSIS — I10 Essential (primary) hypertension: Secondary | ICD-10-CM

## 2013-04-22 LAB — BASIC METABOLIC PANEL
CO2: 30 mEq/L (ref 19–32)
Calcium: 9.9 mg/dL (ref 8.4–10.5)
Creat: 0.92 mg/dL (ref 0.50–1.10)
Sodium: 138 mEq/L (ref 135–145)

## 2013-04-22 MED ORDER — CLONIDINE HCL 0.1 MG PO TABS
0.1000 mg | ORAL_TABLET | Freq: Once | ORAL | Status: AC
  Administered 2013-04-22: 0.1 mg via ORAL

## 2013-04-22 NOTE — Patient Instructions (Signed)
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
## 2013-04-23 LAB — ALDOSTERONE + RENIN ACTIVITY W/ RATIO

## 2013-04-27 LAB — ALDOSTERONE: Aldosterone, Serum: 1 ng/dL

## 2013-05-21 ENCOUNTER — Telehealth: Payer: Self-pay | Admitting: *Deleted

## 2013-05-21 NOTE — Telephone Encounter (Signed)
Spoke to pt and informed her that her lab results came back normal.

## 2013-05-26 ENCOUNTER — Ambulatory Visit: Payer: Self-pay | Admitting: Internal Medicine

## 2013-10-03 ENCOUNTER — Ambulatory Visit: Payer: Self-pay | Admitting: Family Medicine

## 2013-10-03 ENCOUNTER — Telehealth: Payer: Self-pay | Admitting: *Deleted

## 2013-10-03 NOTE — Telephone Encounter (Signed)
Patient calling in wanting more sample blood pressure medication. Patient has not been seen since 04/2013 and no future appointments scheduled. Patient denies headaches and dizziness. Informed patient she needs an office visit so she can get a refill on her medication. Patient given an appointment for today ([id number]) at 2:45 PM. Patient verbalized understanding. Reather LaurenceJamie R Harli Engelken, RN

## 2014-11-30 ENCOUNTER — Emergency Department (HOSPITAL_COMMUNITY): Payer: No Typology Code available for payment source

## 2014-11-30 ENCOUNTER — Emergency Department (HOSPITAL_COMMUNITY)
Admission: EM | Admit: 2014-11-30 | Discharge: 2014-12-01 | Disposition: A | Discharge status: Home / Self Care | Payer: No Typology Code available for payment source | Attending: Emergency Medicine | Admitting: Emergency Medicine

## 2014-11-30 ENCOUNTER — Encounter (HOSPITAL_COMMUNITY): Payer: Self-pay | Admitting: *Deleted

## 2014-11-30 DIAGNOSIS — Y939 Activity, unspecified: Secondary | ICD-10-CM | POA: Insufficient documentation

## 2014-11-30 DIAGNOSIS — S4992XA Unspecified injury of left shoulder and upper arm, initial encounter: Secondary | ICD-10-CM | POA: Insufficient documentation

## 2014-11-30 DIAGNOSIS — S299XXA Unspecified injury of thorax, initial encounter: Secondary | ICD-10-CM | POA: Diagnosis not present

## 2014-11-30 DIAGNOSIS — Y999 Unspecified external cause status: Secondary | ICD-10-CM | POA: Diagnosis not present

## 2014-11-30 DIAGNOSIS — Z79899 Other long term (current) drug therapy: Secondary | ICD-10-CM | POA: Insufficient documentation

## 2014-11-30 DIAGNOSIS — S0990XA Unspecified injury of head, initial encounter: Secondary | ICD-10-CM | POA: Diagnosis not present

## 2014-11-30 DIAGNOSIS — S161XXA Strain of muscle, fascia and tendon at neck level, initial encounter: Secondary | ICD-10-CM | POA: Insufficient documentation

## 2014-11-30 DIAGNOSIS — R200 Anesthesia of skin: Secondary | ICD-10-CM | POA: Diagnosis not present

## 2014-11-30 DIAGNOSIS — R42 Dizziness and giddiness: Secondary | ICD-10-CM | POA: Insufficient documentation

## 2014-11-30 DIAGNOSIS — I10 Essential (primary) hypertension: Secondary | ICD-10-CM | POA: Insufficient documentation

## 2014-11-30 DIAGNOSIS — S199XXA Unspecified injury of neck, initial encounter: Secondary | ICD-10-CM | POA: Diagnosis present

## 2014-11-30 DIAGNOSIS — Y92410 Unspecified street and highway as the place of occurrence of the external cause: Secondary | ICD-10-CM | POA: Diagnosis not present

## 2014-11-30 MED ORDER — MORPHINE SULFATE 4 MG/ML IJ SOLN
4.0000 mg | Freq: Once | INTRAMUSCULAR | Status: AC
  Administered 2014-11-30: 4 mg via INTRAVENOUS
  Filled 2014-11-30: qty 1
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
## 2014-12-01 LAB — I-STAT CHEM 8, ED
BUN: 13 mg/dL (ref 6–20)
CALCIUM ION: 1.24 mmol/L — AB (ref 1.12–1.23)
CREATININE: 0.8 mg/dL (ref 0.44–1.00)
Chloride: 105 mmol/L (ref 101–111)
GLUCOSE: 91 mg/dL (ref 65–99)
HCT: 39 % (ref 36.0–46.0)
Hemoglobin: 13.3 g/dL (ref 12.0–15.0)
Potassium: 3.6 mmol/L (ref 3.5–5.1)
Sodium: 141 mmol/L (ref 135–145)
TCO2: 24 mmol/L (ref 0–100)

## 2014-12-01 MED ORDER — HYDROCODONE-ACETAMINOPHEN 5-325 MG PO TABS: 1.0000 | ORAL_TABLET | Freq: Four times a day (QID) | ORAL | Status: DC | PRN

## 2014-12-01 MED ORDER — MORPHINE SULFATE 4 MG/ML IJ SOLN
4.0000 mg | Freq: Once | INTRAMUSCULAR | Status: AC
  Administered 2014-12-01: 4 mg via INTRAVENOUS
  Filled 2014-12-01: qty 1

## 2014-12-01 MED ORDER — OLMESARTAN MEDOXOMIL-HCTZ 20-12.5 MG PO TABS: 1.0000 | ORAL_TABLET | Freq: Every day | ORAL | Status: DC

## 2014-12-01 NOTE — ED Notes (Signed)
Pt verbalized understanding of d/c instructions and has no further questions. Pt to start on benicar for high BP. BP here upon d/c is 208/113, Dr Criss AlvineGoldston notified and okay'd patient to go home. Pt in NAD upon d/c .
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
## 2014-12-31 ENCOUNTER — Ambulatory Visit: Payer: Self-pay | Attending: Family Medicine | Admitting: Family Medicine

## 2014-12-31 ENCOUNTER — Encounter: Payer: Self-pay | Admitting: Family Medicine

## 2014-12-31 VITALS — BP 182/112 | HR 77 | Temp 98.4°F | Resp 16 | Ht 66.0 in | Wt 144.0 lb

## 2014-12-31 DIAGNOSIS — M62830 Muscle spasm of back: Secondary | ICD-10-CM | POA: Insufficient documentation

## 2014-12-31 DIAGNOSIS — M4682 Other specified inflammatory spondylopathies, cervical region: Secondary | ICD-10-CM | POA: Insufficient documentation

## 2014-12-31 DIAGNOSIS — M5489 Other dorsalgia: Secondary | ICD-10-CM | POA: Insufficient documentation

## 2014-12-31 DIAGNOSIS — Z8673 Personal history of transient ischemic attack (TIA), and cerebral infarction without residual deficits: Secondary | ICD-10-CM

## 2014-12-31 DIAGNOSIS — M549 Dorsalgia, unspecified: Secondary | ICD-10-CM | POA: Insufficient documentation

## 2014-12-31 DIAGNOSIS — I10 Essential (primary) hypertension: Secondary | ICD-10-CM | POA: Insufficient documentation

## 2014-12-31 MED ORDER — TRAMADOL HCL 50 MG PO TABS: 50.0000 mg | ORAL_TABLET | Freq: Three times a day (TID) | ORAL | Status: DC | PRN

## 2014-12-31 MED ORDER — HYDROCHLOROTHIAZIDE 25 MG PO TABS: 25.0000 mg | ORAL_TABLET | Freq: Every day | ORAL | Status: DC

## 2014-12-31 MED ORDER — CYCLOBENZAPRINE HCL 10 MG PO TABS: 10.0000 mg | ORAL_TABLET | Freq: Three times a day (TID) | ORAL | Status: DC | PRN

## 2014-12-31 MED ORDER — AMLODIPINE BESYLATE 10 MG PO TABS: 10.0000 mg | ORAL_TABLET | Freq: Every day | ORAL | Status: DC

## 2014-12-31 MED ORDER — CLONIDINE HCL 0.1 MG PO TABS
0.2000 mg | ORAL_TABLET | Freq: Once | ORAL | Status: AC
Start: 2014-12-31 — End: 2014-12-31
  Administered 2014-12-31: 0.2 mg via ORAL

## 2014-12-31 NOTE — Assessment & Plan Note (Signed)
HTN: uncontrolled Med: none P: BP goal is < 140/90 Start norvasc 10 mg daily Start HCTZ 25 mg daily  Low salt diet
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
## 2015-01-14 ENCOUNTER — Encounter (HOSPITAL_COMMUNITY): Payer: Self-pay | Admitting: Emergency Medicine

## 2015-01-14 ENCOUNTER — Emergency Department (INDEPENDENT_AMBULATORY_CARE_PROVIDER_SITE_OTHER)
Admission: EM | Admit: 2015-01-14 | Discharge: 2015-01-14 | Disposition: A | Discharge status: Home / Self Care | Payer: Self-pay | Source: Home / Self Care | Attending: Family Medicine | Admitting: Family Medicine

## 2015-01-14 DIAGNOSIS — I10 Essential (primary) hypertension: Secondary | ICD-10-CM

## 2015-01-14 DIAGNOSIS — M791 Myalgia, unspecified site: Secondary | ICD-10-CM

## 2015-01-14 DIAGNOSIS — M609 Myositis, unspecified: Secondary | ICD-10-CM

## 2015-01-14 MED ORDER — DICLOFENAC SODIUM 1 % TD GEL: 1.0000 "application " | Freq: Four times a day (QID) | TRANSDERMAL | Status: DC
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
## 2015-01-25 ENCOUNTER — Ambulatory Visit: Payer: No Typology Code available for payment source | Attending: Family Medicine

## 2015-09-24 ENCOUNTER — Emergency Department (HOSPITAL_COMMUNITY)
Admission: EM | Admit: 2015-09-24 | Discharge: 2015-09-24 | Disposition: A | Discharge status: Home / Self Care | Payer: No Typology Code available for payment source | Attending: Emergency Medicine | Admitting: Emergency Medicine

## 2015-09-24 ENCOUNTER — Encounter (HOSPITAL_COMMUNITY): Payer: Self-pay | Admitting: Emergency Medicine

## 2015-09-24 DIAGNOSIS — I10 Essential (primary) hypertension: Secondary | ICD-10-CM | POA: Insufficient documentation

## 2015-09-24 DIAGNOSIS — Z79891 Long term (current) use of opiate analgesic: Secondary | ICD-10-CM | POA: Insufficient documentation

## 2015-09-24 DIAGNOSIS — M25512 Pain in left shoulder: Secondary | ICD-10-CM | POA: Insufficient documentation

## 2015-09-24 DIAGNOSIS — Z79899 Other long term (current) drug therapy: Secondary | ICD-10-CM | POA: Insufficient documentation

## 2015-09-24 MED ORDER — CYCLOBENZAPRINE HCL 10 MG PO TABS: 10.0000 mg | ORAL_TABLET | Freq: Two times a day (BID) | ORAL | Status: DC | PRN

## 2015-09-24 MED ORDER — ONDANSETRON 4 MG PO TBDP
4.0000 mg | ORAL_TABLET | Freq: Once | ORAL | Status: AC
  Administered 2015-09-24: 4 mg via ORAL
  Filled 2015-09-24: qty 1

## 2015-09-24 MED ORDER — TRAMADOL HCL 50 MG PO TABS: 50.0000 mg | ORAL_TABLET | Freq: Four times a day (QID) | ORAL | Status: DC | PRN

## 2015-09-24 MED ORDER — OXYCODONE-ACETAMINOPHEN 5-325 MG PO TABS
1.0000 | ORAL_TABLET | Freq: Once | ORAL | Status: AC
  Administered 2015-09-24: 1 via ORAL
  Filled 2015-09-24: qty 1

## 2015-09-24 MED ORDER — HYDROCHLOROTHIAZIDE 25 MG PO TABS: 25.0000 mg | ORAL_TABLET | Freq: Every day | ORAL | Status: DC

## 2015-09-24 MED ORDER — NAPROXEN 375 MG PO TABS: 375.0000 mg | ORAL_TABLET | Freq: Two times a day (BID) | ORAL | Status: DC

## 2015-09-24 NOTE — ED Notes (Signed)
Pt reports L sided back spasm x 1 week. Pt states difficulty turning neck and twisting back. Pt states BP high today, stating she uses vinegar and garlic, does not take HTN meds d/t budget.
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
## 2015-09-24 NOTE — Discharge Instructions (Signed)
Cryotherapy °Cryotherapy means treatment with cold. Ice or gel packs can be used to reduce both pain and swelling. Ice is the most helpful within the first 24 to 48 hours after an injury or flare-up from overusing a muscle or joint. Sprains, strains, spasms, burning pain, shooting pain, and aches can all be eased with ice. Ice can also be used when recovering from surgery. Ice is effective, has very few side effects, and is safe for most people to use. °PRECAUTIONS  °Ice is not a safe treatment option for people with: °· Raynaud phenomenon. This is a condition affecting small blood vessels in the extremities. Exposure to cold may cause your problems to return. °· Cold hypersensitivity. There are many forms of cold hypersensitivity, including: °· Cold urticaria. Red, itchy hives appear on the skin when the tissues begin to warm after being iced. °· Cold erythema. This is a red, itchy rash caused by exposure to cold. °· Cold hemoglobinuria. Red blood cells break down when the tissues begin to warm after being iced. The hemoglobin that carry oxygen are passed into the urine because they cannot combine with blood proteins fast enough. °· Numbness or altered sensitivity in the area being iced. °If you have any of the following conditions, do not use ice until you have discussed cryotherapy with your caregiver: °· Heart conditions, such as arrhythmia, angina, or chronic heart disease. °· High blood pressure. °· Healing wounds or open skin in the area being iced. °· Current infections. °· Rheumatoid arthritis. °· Poor circulation. °· Diabetes. °Ice slows the blood flow in the region it is applied. This is beneficial when trying to stop inflamed tissues from spreading irritating chemicals to surrounding tissues. However, if you expose your skin to cold temperatures for too long or without the proper protection, you can damage your skin or nerves. Watch for signs of skin damage due to cold. °HOME CARE INSTRUCTIONS °Follow  these tips to use ice and cold packs safely. °· Place a dry or damp towel between the ice and skin. A damp towel will cool the skin more quickly, so you may need to shorten the time that the ice is used. °· For a more rapid response, add gentle compression to the ice. °· Ice for no more than 10 to 20 minutes at a time. The bonier the area you are icing, the less time it will take to get the benefits of ice. °· Check your skin after 5 minutes to make sure there are no signs of a poor response to cold or skin damage. °· Rest 20 minutes or more between uses. °· Once your skin is numb, you can end your treatment. You can test numbness by very lightly touching your skin. The touch should be so light that you do not see the skin dimple from the pressure of your fingertip. When using ice, most people will feel these normal sensations in this order: cold, burning, aching, and numbness. °· Do not use ice on someone who cannot communicate their responses to pain, such as small children or people with dementia. °HOW TO MAKE AN ICE PACK °Ice packs are the most common way to use ice therapy. Other methods include ice massage, ice baths, and cryosprays. Muscle creams that cause a cold, tingly feeling do not offer the same benefits that ice offers and should not be used as a substitute unless recommended by your caregiver. °To make an ice pack, do one of the following: °· Place crushed ice or a   bag of frozen vegetables in a sealable plastic bag. Squeeze out the excess air. Place this bag inside another plastic bag. Slide the bag into a pillowcase or place a damp towel between your skin and the bag.  Mix 3 parts water with 1 part rubbing alcohol. Freeze the mixture in a sealable plastic bag. When you remove the mixture from the freezer, it will be slushy. Squeeze out the excess air. Place this bag inside another plastic bag. Slide the bag into a pillowcase or place a damp towel between your skin and the bag. SEEK MEDICAL CARE  IF:  You develop white spots on your skin. This may give the skin a blotchy (mottled) appearance.  Your skin turns blue or pale.  Your skin becomes waxy or hard.  Your swelling gets worse. MAKE SURE YOU:   Understand these instructions.  Will watch your condition.  Will get help right away if you are not doing well or get worse.   This information is not intended to replace advice given to you by your health care provider. Make sure you discuss any questions you have with your health care provider.   Document Released: 01/16/2011 Document Revised: 06/12/2014 Document Reviewed: 01/16/2011 Elsevier Interactive Patient Education 2016 [employer]  Hypertension Hypertension, commonly called high blood pressure, is when the force of blood pumping through your arteries is too strong. Your arteries are the blood vessels that carry blood from your heart throughout your body. A blood pressure reading consists of a higher number over a lower number, such as 110/72. The higher number (systolic) is the pressure inside your arteries when your heart pumps. The lower number (diastolic) is the pressure inside your arteries when your heart relaxes. Ideally you want your blood pressure below 120/80. Hypertension forces your heart to work harder to pump blood. Your arteries may become narrow or stiff. Having untreated or uncontrolled hypertension can cause heart attack, stroke, kidney disease, and other problems. RISK FACTORS Some risk factors for high blood pressure are controllable. Others are not.  Risk factors you cannot control include:   Race. You may be at higher risk if you are African American.  Age. Risk increases with age.  Gender. Men are at higher risk than women before age 47 years. After age 47, women are at higher risk than men. Risk factors you can control include:  Not getting enough exercise or physical activity.  Being overweight.  Getting too much fat, sugar, calories, or  salt in your diet.  Drinking too much alcohol. SIGNS AND SYMPTOMS Hypertension does not usually cause signs or symptoms. Extremely high blood pressure (hypertensive crisis) may cause headache, anxiety, shortness of breath, and nosebleed. DIAGNOSIS To check if you have hypertension, your health care provider will measure your blood pressure while you are seated, with your arm held at the level of your heart. It should be measured at least twice using the same arm. Certain conditions can cause a difference in blood pressure between your right and left arms. A blood pressure reading that is higher than normal on one occasion does not mean that you need treatment. If it is not clear whether you have high blood pressure, you may be asked to return on a different day to have your blood pressure checked again. Or, you may be asked to monitor your blood pressure at home for 1 or more weeks. TREATMENT Treating high blood pressure includes making lifestyle changes and possibly taking medicine. Living a healthy lifestyle can help lower  high blood pressure. You may need to change some of your habits. Lifestyle changes may include:  Following the DASH diet. This diet is high in fruits, vegetables, and whole grains. It is low in salt, red meat, and added sugars.  Keep your sodium intake below 2,300 mg per day.  Getting at least 30-45 minutes of aerobic exercise at least 4 times per week.  Losing weight if necessary.  Not smoking.  Limiting alcoholic beverages.  Learning ways to reduce stress. Your health care provider may prescribe medicine if lifestyle changes are not enough to get your blood pressure under control, and if one of the following is true:  You are 53-80 years of age and your systolic blood pressure is above 140.  You are 38 years of age or older, and your systolic blood pressure is above 150.  Your diastolic blood pressure is above 90.  You have diabetes, and your systolic blood  pressure is over 140 or your diastolic blood pressure is over 90.  You have kidney disease and your blood pressure is above 140/90.  You have heart disease and your blood pressure is above 140/90. Your personal target blood pressure may vary depending on your medical conditions, your age, and other factors. HOME CARE INSTRUCTIONS  Have your blood pressure rechecked as directed by your health care provider.   Take medicines only as directed by your health care provider. Follow the directions carefully. Blood pressure medicines must be taken as prescribed. The medicine does not work as well when you skip doses. Skipping doses also puts you at risk for problems.  Do not smoke.   Monitor your blood pressure at home as directed by your health care provider. SEEK MEDICAL CARE IF:   You think you are having a reaction to medicines taken.  You have recurrent headaches or feel dizzy.  You have swelling in your ankles.  You have trouble with your vision. SEEK IMMEDIATE MEDICAL CARE IF:  You develop a severe headache or confusion.  You have unusual weakness, numbness, or feel faint.  You have severe chest or abdominal pain.  You vomit repeatedly.  You have trouble breathing. MAKE SURE YOU:   Understand these instructions.  Will watch your condition.  Will get help right away if you are not doing well or get worse.   This information is not intended to replace advice given to you by your health care provider. Make sure you discuss any questions you have with your health care provider.   Document Released: 05/22/2005 Document Revised: 10/06/2014 Document Reviewed: 03/14/2013 Elsevier Interactive Patient Education 2016 [employer] Inc.  Shoulder Pain The shoulder is the joint that connects your arms to your body. The bones that form the shoulder joint include the upper arm bone (humerus), the shoulder blade (scapula), and the collarbone (clavicle). The top of the humerus is shaped  like a ball and fits into a rather flat socket on the scapula (glenoid cavity). A combination of muscles and strong, fibrous tissues that connect muscles to bones (tendons) support your shoulder joint and hold the ball in the socket. Small, fluid-filled sacs (bursae) are located in different areas of the joint. They act as cushions between the bones and the overlying soft tissues and help reduce friction between the gliding tendons and the bone as you move your arm. Your shoulder joint allows a wide range of motion in your arm. This range of motion allows you to do things like scratch your back or throw a ball.  However, this range of motion also makes your shoulder more prone to pain from overuse and injury. Causes of shoulder pain can originate from both injury and overuse and usually can be grouped in the following four categories:  Redness, swelling, and pain (inflammation) of the tendon (tendinitis) or the bursae (bursitis).  Instability, such as a dislocation of the joint.  Inflammation of the joint (arthritis).  Broken bone (fracture). HOME CARE INSTRUCTIONS   Apply ice to the sore area.  Put ice in a plastic bag.  Place a towel between your skin and the bag.  Leave the ice on for 15-20 minutes, 3-4 times per day for the first 2 days, or as directed by your health care provider.  Stop using cold packs if they do not help with the pain.  If you have a shoulder sling or immobilizer, wear it as long as your caregiver instructs. Only remove it to shower or bathe. Move your arm as little as possible, but keep your hand moving to prevent swelling.  Squeeze a soft ball or foam pad as much as possible to help prevent swelling.  Only take over-the-counter or prescription medicines for pain, discomfort, or fever as directed by your caregiver. SEEK MEDICAL CARE IF:   Your shoulder pain increases, or new pain develops in your arm, hand, or fingers.  Your hand or fingers become cold and  numb.  Your pain is not relieved with medicines. SEEK IMMEDIATE MEDICAL CARE IF:   Your arm, hand, or fingers are numb or tingling.  Your arm, hand, or fingers are significantly swollen or turn white or blue. MAKE SURE YOU:   Understand these instructions.  Will watch your condition.  Will get help right away if you are not doing well or get worse.   This information is not intended to replace advice given to you by your health care provider. Make sure you discuss any questions you have with your health care provider.   Document Released: 03/01/2005 Document Revised: 06/12/2014 Document Reviewed: 09/14/2014 Elsevier Interactive Patient Education [employer]

## 2015-09-24 NOTE — ED Notes (Signed)
PA notified of pt.'s latest BP readings.

## 2017-01-16 ENCOUNTER — Encounter (HOSPITAL_COMMUNITY): Payer: Self-pay | Admitting: Emergency Medicine

## 2017-01-16 ENCOUNTER — Ambulatory Visit (HOSPITAL_COMMUNITY)
Admission: EM | Admit: 2017-01-16 | Discharge: 2017-01-16 | Disposition: A | Discharge status: Home / Self Care | Payer: Self-pay | Attending: Emergency Medicine | Admitting: Emergency Medicine

## 2017-01-16 DIAGNOSIS — H9202 Otalgia, left ear: Secondary | ICD-10-CM

## 2017-01-16 DIAGNOSIS — H6502 Acute serous otitis media, left ear: Secondary | ICD-10-CM

## 2017-01-16 DIAGNOSIS — I10 Essential (primary) hypertension: Secondary | ICD-10-CM

## 2017-01-16 MED ORDER — AMOXICILLIN 500 MG PO CAPS: 1000.0000 mg | ORAL_CAPSULE | Freq: Two times a day (BID) | ORAL | 0 refills | Status: DC

## 2017-01-16 NOTE — Discharge Instructions (Signed)
Take the amoxicillin as directed. He may also apply 1% hydrocortisone cream to the small bumps in the outer ear. Do not put it in the year. Try to keep your blood pressure medication filled

## 2017-01-16 NOTE — ED Triage Notes (Signed)
The patient presented to the [facility] with a complaint of left ear pain x 3 days. 
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
## 2017-05-01 ENCOUNTER — Emergency Department (HOSPITAL_COMMUNITY)
Admission: EM | Admit: 2017-05-01 | Discharge: 2017-05-02 | Disposition: A | Discharge status: Home / Self Care | Payer: Self-pay | Attending: Emergency Medicine | Admitting: Emergency Medicine

## 2017-05-01 ENCOUNTER — Encounter (HOSPITAL_COMMUNITY): Payer: Self-pay | Admitting: Emergency Medicine

## 2017-05-01 ENCOUNTER — Other Ambulatory Visit: Payer: Self-pay

## 2017-05-01 DIAGNOSIS — F1210 Cannabis abuse, uncomplicated: Secondary | ICD-10-CM | POA: Insufficient documentation

## 2017-05-01 DIAGNOSIS — R51 Headache: Secondary | ICD-10-CM | POA: Insufficient documentation

## 2017-05-01 DIAGNOSIS — R200 Anesthesia of skin: Secondary | ICD-10-CM | POA: Insufficient documentation

## 2017-05-01 DIAGNOSIS — R030 Elevated blood-pressure reading, without diagnosis of hypertension: Secondary | ICD-10-CM | POA: Insufficient documentation

## 2017-05-01 DIAGNOSIS — R519 Headache, unspecified: Secondary | ICD-10-CM

## 2017-05-01 NOTE — ED Triage Notes (Signed)
Pt states she has had a hx of HTN but has been off of her medication for 10 years. She states that recently been taking her BP at home and noticing that it has been high and that she had numbness in her arm on Sunday but that has resolved. Pt currently reporting no c/o pain or numbness at this time.

## 2017-05-02 ENCOUNTER — Emergency Department (HOSPITAL_COMMUNITY): Payer: Self-pay

## 2017-05-02 LAB — CBC WITH DIFFERENTIAL/PLATELET
BASOS ABS: 0 10*3/uL (ref 0.0–0.1)
BASOS PCT: 0 %
EOS ABS: 0.3 10*3/uL (ref 0.0–0.7)
EOS PCT: 3 %
HCT: 40 % (ref 36.0–46.0)
HEMOGLOBIN: 13.2 g/dL (ref 12.0–15.0)
Lymphocytes Relative: 44 %
Lymphs Abs: 3.7 10*3/uL (ref 0.7–4.0)
MCH: 33 pg (ref 26.0–34.0)
MCHC: 33 g/dL (ref 30.0–36.0)
MCV: 100 fL (ref 78.0–100.0)
Monocytes Absolute: 0.7 10*3/uL (ref 0.1–1.0)
Monocytes Relative: 9 %
Neutro Abs: 3.8 10*3/uL (ref 1.7–7.7)
Neutrophils Relative %: 44 %
PLATELETS: 286 10*3/uL (ref 150–400)
RBC: 4 MIL/uL (ref 3.87–5.11)
RDW: 13.4 % (ref 11.5–15.5)
WBC: 8.5 10*3/uL (ref 4.0–10.5)

## 2017-05-02 LAB — BASIC METABOLIC PANEL
ANION GAP: 9 (ref 5–15)
BUN: 14 mg/dL (ref 6–20)
CHLORIDE: 106 mmol/L (ref 101–111)
CO2: 20 mmol/L — ABNORMAL LOW (ref 22–32)
Calcium: 9.7 mg/dL (ref 8.9–10.3)
Creatinine, Ser: 0.8 mg/dL (ref 0.44–1.00)
GFR calc Af Amer: >60 mL/min (ref >60)
Glucose, Bld: 91 mg/dL (ref 65–99)
Potassium: 3.6 mmol/L (ref 3.5–5.1)
Sodium: 135 mmol/L (ref 135–145)

## 2017-05-02 MED ORDER — LABETALOL HCL 5 MG/ML IV SOLN
20.0000 mg | Freq: Once | INTRAVENOUS | Status: AC
  Administered 2017-05-02: 20 mg via INTRAVENOUS
  Filled 2017-05-02: qty 4

## 2017-05-02 MED ORDER — KETOROLAC TROMETHAMINE 15 MG/ML IJ SOLN
15.0000 mg | Freq: Once | INTRAMUSCULAR | Status: AC
  Administered 2017-05-02: 15 mg via INTRAVENOUS
  Filled 2017-05-02: qty 1

## 2017-05-02 MED ORDER — METOCLOPRAMIDE HCL 5 MG/ML IJ SOLN
10.0000 mg | Freq: Once | INTRAMUSCULAR | Status: AC
  Administered 2017-05-02: 10 mg via INTRAVENOUS
  Filled 2017-05-02: qty 2

## 2017-05-02 MED ORDER — DIPHENHYDRAMINE HCL 50 MG/ML IJ SOLN
25.0000 mg | Freq: Once | INTRAMUSCULAR | Status: AC
  Administered 2017-05-02: 25 mg via INTRAVENOUS
  Filled 2017-05-02: qty 1

## 2017-05-02 MED ORDER — HYDROCHLOROTHIAZIDE 12.5 MG PO TABS: 12.5000 mg | ORAL_TABLET | Freq: Every day | ORAL | 1 refills | Status: DC
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
## 2017-05-11 ENCOUNTER — Other Ambulatory Visit: Payer: Self-pay

## 2017-05-11 ENCOUNTER — Encounter: Payer: Self-pay | Admitting: Family Medicine

## 2017-05-11 ENCOUNTER — Ambulatory Visit: Payer: Self-pay | Attending: Family Medicine | Admitting: Family Medicine

## 2017-05-11 VITALS — BP 169/109 | HR 89 | Temp 98.3°F | Resp 18 | Ht 66.0 in | Wt 153.6 lb

## 2017-05-11 DIAGNOSIS — I10 Essential (primary) hypertension: Secondary | ICD-10-CM | POA: Insufficient documentation

## 2017-05-11 DIAGNOSIS — Z8249 Family history of ischemic heart disease and other diseases of the circulatory system: Secondary | ICD-10-CM | POA: Insufficient documentation

## 2017-05-11 DIAGNOSIS — R9431 Abnormal electrocardiogram [ECG] [EKG]: Secondary | ICD-10-CM | POA: Insufficient documentation

## 2017-05-11 LAB — POCT UA - MICROALBUMIN
Creatinine, POC: 200 mg/dL
Microalbumin Ur, POC: 150 mg/L

## 2017-05-11 MED ORDER — CLONIDINE HCL 0.2 MG PO TABS
0.2000 mg | ORAL_TABLET | Freq: Once | ORAL | Status: AC
  Administered 2017-05-11: 0.2 mg via ORAL

## 2017-05-11 MED ORDER — AMLODIPINE BESYLATE 10 MG PO TABS: 10.0000 mg | ORAL_TABLET | Freq: Every day | ORAL | 2 refills | Status: DC

## 2017-05-11 MED FILL — AMLODIPINE BESYLATE 10 MG T: 10 | 30 days supply | Qty: 30 | Fill #0

## 2017-05-11 NOTE — Patient Instructions (Signed)
Managing Your Hypertension Hypertension is commonly called high blood pressure. This is when the force of your blood pressing against the walls of your arteries is too strong. Arteries are blood vessels that carry blood from your heart throughout your body. Hypertension forces the heart to work harder to pump blood, and may cause the arteries to become narrow or stiff. Having untreated or uncontrolled hypertension can cause heart attack, stroke, kidney disease, and other problems. What are blood pressure readings? A blood pressure reading consists of a higher number over a lower number. Ideally, your blood pressure should be below 120/80. The first ("top") number is called the systolic pressure. It is a measure of the pressure in your arteries as your heart beats. The second ("bottom") number is called the diastolic pressure. It is a measure of the pressure in your arteries as the heart relaxes. What does my blood pressure reading mean? Blood pressure is classified into four stages. Based on your blood pressure reading, your health care provider may use the following stages to determine what type of treatment you need, if any. Systolic pressure and diastolic pressure are measured in a unit called mm Hg. Normal  Systolic pressure: below 120.  Diastolic pressure: below 80. Elevated  Systolic pressure: 120-129.  Diastolic pressure: below 80. Hypertension stage 1  Systolic pressure: 130-139.  Diastolic pressure: 80-89. Hypertension stage 2  Systolic pressure: 140 or above.  Diastolic pressure: 90 or above. What health risks are associated with hypertension? Managing your hypertension is an important responsibility. Uncontrolled hypertension can lead to:  A heart attack.  A stroke.  A weakened blood vessel (aneurysm).  Heart failure.  Kidney damage.  Eye damage.  Metabolic syndrome.  Memory and concentration problems.  What changes can I make to manage my  hypertension? Hypertension can be managed by making lifestyle changes and possibly by taking medicines. Your health care provider will help you make a plan to bring your blood pressure within a normal range. Eating and drinking  Eat a diet that is high in fiber and potassium, and low in salt (sodium), added sugar, and fat. An example eating plan is called the DASH (Dietary Approaches to Stop Hypertension) diet. To eat this way: ? Eat plenty of fresh fruits and vegetables. Try to fill half of your plate at each meal with fruits and vegetables. ? Eat whole grains, such as whole wheat pasta, brown rice, or whole grain bread. Fill about one quarter of your plate with whole grains. ? Eat low-fat diary products. ? Avoid fatty cuts of meat, processed or cured meats, and poultry with skin. Fill about one quarter of your plate with lean proteins such as fish, chicken without skin, beans, eggs, and tofu. ? Avoid premade and processed foods. These tend to be higher in sodium, added sugar, and fat.  Reduce your daily sodium intake. Most people with hypertension should eat less than 1,500 mg of sodium a day.  Limit alcohol intake to no more than 1 drink a day for nonpregnant women and 2 drinks a day for men. One drink equals 12 oz of beer, 5 oz of wine, or 1 oz of hard liquor. Lifestyle  Work with your health care provider to maintain a healthy body weight, or to lose weight. Ask what an ideal weight is for you.  Get at least 30 minutes of exercise that causes your heart to beat faster (aerobic exercise) most days of the week. Activities may include walking, swimming, or biking.  Include exercise   to strengthen your muscles (resistance exercise), such as weight lifting, as part of your weekly exercise routine. Try to do these types of exercises for 30 minutes at least 3 days a week.  Do not use any products that contain nicotine or tobacco, such as cigarettes and e-cigarettes. If you need help quitting, ask  your health care provider.  Control any long-term (chronic) conditions you have, such as high cholesterol or diabetes. Monitoring  Monitor your blood pressure at home as told by your health care provider. Your personal target blood pressure may vary depending on your medical conditions, your age, and other factors.  Have your blood pressure checked regularly, as often as told by your health care provider. Working with your health care provider  Review all the medicines you take with your health care provider because there may be side effects or interactions.  Talk with your health care provider about your diet, exercise habits, and other lifestyle factors that may be contributing to hypertension.  Visit your health care provider regularly. Your health care provider can help you create and adjust your plan for managing hypertension. Will I need medicine to control my blood pressure? Your health care provider may prescribe medicine if lifestyle changes are not enough to get your blood pressure under control, and if:  Your systolic blood pressure is 130 or higher.  Your diastolic blood pressure is 80 or higher.  Take medicines only as told by your health care provider. Follow the directions carefully. Blood pressure medicines must be taken as prescribed. The medicine does not work as well when you skip doses. Skipping doses also puts you at risk for problems. Contact a health care provider if:  You think you are having a reaction to medicines you have taken.  You have repeated (recurrent) headaches.  You feel dizzy.  You have swelling in your ankles.  You have trouble with your vision. Get help right away if:  You develop a severe headache or confusion.  You have unusual weakness or numbness, or you feel faint.  You have severe pain in your chest or abdomen.  You vomit repeatedly.  You have trouble breathing. Summary  Hypertension is when the force of blood pumping through  your arteries is too strong. If this condition is not controlled, it may put you at risk for serious complications.  Your personal target blood pressure may vary depending on your medical conditions, your age, and other factors. For most people, a normal blood pressure is less than 120/80.  Hypertension is managed by lifestyle changes, medicines, or both. Lifestyle changes include weight loss, eating a healthy, low-sodium diet, exercising more, and limiting alcohol. This information is not intended to replace advice given to you by your health care provider. Make sure you discuss any questions you have with your health care provider. Document Released: 02/14/2012 Document Revised: 04/19/2016 Document Reviewed: 04/19/2016 Elsevier Interactive Patient Education  2018 Elsevier Inc.  

## 2017-05-11 NOTE — Progress Notes (Signed)
Patient is here for hospital f/up  

## 2017-05-11 NOTE — Progress Notes (Signed)
Subjective:  Patient ID: [name], female    DOB: 1971-01-16  Age: 46 y.o. MRN: [id number]  CC: Hypertension   HPI Melissa EdwardsFelicia G Tatlock presents for hypertension.  She reports being diagnosed with hypertension in 2005, with poor medication adherence.  HTN: She is not exercising and is not adherent to low salt diet.  She does not check BP at home. Cardiac symptoms none. Patient denies chest pain, dyspnea, near-syncope, palpitations, syncope and headaches.  Cardiovascular risk factors: hypertension, sedentary lifestyle and history of smoking/ tobacco exposure.  She reports quitting smoking 21 years ago.  Use of agents associated with hypertension: none. History of target organ damage: none.  Family history of hypertension-mother, heart failure-mother, chronic kidney disease with dialysis-father (deceased 2016), lung cancer- maternal grandmother.  BP found to be elevated in office history of recent ED visit related to elevated blood pressures 04/2017.  She reports taking hydrochlorothiazide daily.  Outpatient Medications Prior to Visit  Medication Sig Dispense Refill  . hydrochlorothiazide (HYDRODIURIL) 12.5 MG tablet Take 1 tablet (12.5 mg total) by mouth daily. 30 tablet 1   No facility-administered medications prior to visit.     ROS Review of Systems  Constitutional: Negative.   Eyes: Negative.   Respiratory: Negative.   Cardiovascular: Negative.   Gastrointestinal: Negative.   Skin: Negative.   Neurological: Negative for syncope and headaches.        Objective:  BP (!) 169/109   Pulse 89   Temp 98.3 F (36.8 C) (Oral)   Resp 18   Ht 5\' 6"  (1.676 m)   Wt 153 lb 9.6 oz (69.7 kg)   SpO2 95%   BMI 24.79 kg/m   BP/Weight 05/11/2017 05/02/2017 05/01/2017  Systolic BP 169 194 -  Diastolic BP 109 129 -  Wt. (Lbs) 153.6 - 145  BMI 24.79 - 23.4     Physical Exam  Constitutional: She appears well-developed and well-nourished.  Eyes: Conjunctivae are normal.  Pupils are equal, round, and reactive to light.  Neck: No JVD present.  Cardiovascular: Normal rate, regular rhythm, normal heart sounds and intact distal pulses.  Pulmonary/Chest: Effort normal and breath sounds normal.  Abdominal: Soft. Bowel sounds are normal. There is no tenderness.  Skin: Skin is warm and dry.  Nursing note and vitals reviewed.   Depression screen [facility] 2/9 05/11/2017 12/31/2014  Decreased Interest 0 0  Down, Depressed, Hopeless 0 0  PHQ - 2 Score 0 0  Altered sleeping 2 -  Tired, decreased energy 3 -  Change in appetite 2 -  Feeling bad or failure about yourself  0 -  Trouble concentrating 0 -  Moving slowly or fidgety/restless 0 -  Suicidal thoughts 0 -  PHQ-9 Score 7 -    GAD 7 : Generalized Anxiety Score 05/11/2017  Nervous, Anxious, on Edge 0  Control/stop worrying 0  Worry too much - different things 0  Trouble relaxing 0  Restless 0  Easily annoyed or irritable 0  Afraid - awful might happen 0  Total GAD 7 Score 0       Assessment & Plan:   1. Malignant hypertension Amlodipine started.  Elevated BP clonidine given per protocol. Follow-up with clinical pharmacist in 2 weeks for BP check. Follow-up with PCP in 3 months. - cloNIDine (CATAPRES) tablet 0.2 mg - Lipid Panel - POCT UA - Microalbumin - amLODipine (NORVASC) 10 MG tablet; Take 1 tablet (10 mg total) by mouth daily.  Dispense: 30 tablet; Refill: 2 - CMP and  Liver - EKG 12-Lead - ECHOCARDIOGRAM COMPLETE; Future  2. Abnormal ECG EKG findings consistent with LVH. - ECHOCARDIOGRAM COMPLETE; Future      Follow-up: Return in about 2 weeks (around 05/25/2017) for BP with Stacy .   Lizbeth BarkMandesia R Amarii Amy FNP

## 2017-05-12 LAB — LIPID PANEL
CHOL/HDL RATIO: 2.1 ratio (ref 0.0–4.4)
Cholesterol, Total: 142 mg/dL (ref 100–199)
HDL: 67 mg/dL (ref >39)
LDL CALC: 64 mg/dL (ref 0–99)
Triglycerides: 56 mg/dL (ref 0–149)
VLDL CHOLESTEROL CAL: 11 mg/dL (ref 5–40)

## 2017-05-12 LAB — CMP AND LIVER
ALT: 12 IU/L (ref 0–32)
AST: 17 IU/L (ref 0–40)
Albumin: 4.1 g/dL (ref 3.5–5.5)
Alkaline Phosphatase: 45 IU/L (ref 39–117)
BILIRUBIN TOTAL: 0.3 mg/dL (ref 0.0–1.2)
BUN: 16 mg/dL (ref 6–24)
Bilirubin, Direct: 0.09 mg/dL (ref 0.00–0.40)
CHLORIDE: 100 mmol/L (ref 96–106)
CO2: 26 mmol/L (ref 20–29)
CREATININE: 0.97 mg/dL (ref 0.57–1.00)
Calcium: 9.3 mg/dL (ref 8.7–10.2)
GFR calc non Af Amer: 69 mL/min/{1.73_m2} (ref >59)
GFR, EST AFRICAN AMERICAN: 80 mL/min/{1.73_m2} (ref >59)
Glucose: 110 mg/dL — ABNORMAL HIGH (ref 65–99)
Potassium: 4.2 mmol/L (ref 3.5–5.2)
Sodium: 136 mmol/L (ref 134–144)
Total Protein: 7.1 g/dL (ref 6.0–8.5)

## 2017-05-23 ENCOUNTER — Ambulatory Visit: Payer: Self-pay | Admitting: Pharmacist

## 2017-05-23 ENCOUNTER — Telehealth: Payer: Self-pay | Admitting: Pharmacist

## 2017-05-23 NOTE — Telephone Encounter (Signed)
Called patient to see if she could come before 2pm (1:30pm or before) or if she could come after 3pm. Left VM.

## 2017-05-25 ENCOUNTER — Ambulatory Visit (HOSPITAL_COMMUNITY): Payer: Self-pay

## 2017-05-30 ENCOUNTER — Ambulatory Visit (HOSPITAL_COMMUNITY)
Admission: RE | Admit: 2017-05-30 | Discharge: 2017-05-30 | Disposition: A | Discharge status: Home / Self Care | Payer: Self-pay | Source: Ambulatory Visit | Attending: Family Medicine | Admitting: Family Medicine

## 2017-05-30 DIAGNOSIS — I10 Essential (primary) hypertension: Secondary | ICD-10-CM | POA: Insufficient documentation

## 2017-05-30 DIAGNOSIS — I351 Nonrheumatic aortic (valve) insufficiency: Secondary | ICD-10-CM | POA: Insufficient documentation

## 2017-05-30 DIAGNOSIS — R9431 Abnormal electrocardiogram [ECG] [EKG]: Secondary | ICD-10-CM | POA: Insufficient documentation

## 2017-05-30 NOTE — Progress Notes (Signed)
  Echocardiogram 2D Echocardiogram has been performed.  Celene SkeenVijay  Sheila Ocasio 05/30/2017, 4:05 PM

## 2017-06-06 ENCOUNTER — Other Ambulatory Visit: Payer: Self-pay | Admitting: Family Medicine

## 2017-06-06 DIAGNOSIS — I351 Nonrheumatic aortic (valve) insufficiency: Secondary | ICD-10-CM

## 2017-06-06 DIAGNOSIS — R931 Abnormal findings on diagnostic imaging of heart and coronary circulation: Secondary | ICD-10-CM

## 2017-06-12 ENCOUNTER — Telehealth (INDEPENDENT_AMBULATORY_CARE_PROVIDER_SITE_OTHER): Payer: Self-pay | Admitting: *Deleted

## 2017-06-12 NOTE — Telephone Encounter (Signed)
-----   Message from Lizbeth BarkMandesia R Hairston, [place] sent at 06/06/2017  1:44 PM EST ----- -Echocardiogram which looks at your hearts pumping ability was normal. There is enlargement of the left ventricle (chamber) of the heart. This can occur with elevated BP overtime. Continue to take your medications for blood pressure, limit salt and fat intake, increase physical activity. -Mild aortic valve regurgitation. You will be referred to cardiology.

## 2017-06-12 NOTE — Telephone Encounter (Signed)
Patient verified DOB Patient is aware of heart pumping being normal but enlargement of the left ventricle being noted. Patient is also aware of Aortic valve showing some regurgitation. Patent advised to continue taking BP medications and limiting fat and salt intake. Patient advised to increase exercise. Patient is aware of referral being placed with [facility] and was advised to contact the office if she does not hear anything this week. No further questions at this time.

## 2017-07-11 ENCOUNTER — Encounter: Payer: Self-pay | Admitting: Interventional Cardiology

## 2017-07-19 ENCOUNTER — Ambulatory Visit (INDEPENDENT_AMBULATORY_CARE_PROVIDER_SITE_OTHER): Payer: Self-pay | Admitting: Interventional Cardiology

## 2017-07-19 ENCOUNTER — Encounter: Payer: Self-pay | Admitting: Interventional Cardiology

## 2017-07-19 ENCOUNTER — Other Ambulatory Visit: Payer: Self-pay | Admitting: Family Medicine

## 2017-07-19 ENCOUNTER — Encounter (INDEPENDENT_AMBULATORY_CARE_PROVIDER_SITE_OTHER): Payer: Self-pay

## 2017-07-19 VITALS — BP 162/100 | HR 78 | Ht 66.0 in | Wt 152.0 lb

## 2017-07-19 DIAGNOSIS — I10 Essential (primary) hypertension: Secondary | ICD-10-CM

## 2017-07-19 DIAGNOSIS — Z8673 Personal history of transient ischemic attack (TIA), and cerebral infarction without residual deficits: Secondary | ICD-10-CM

## 2017-07-19 DIAGNOSIS — I517 Cardiomegaly: Secondary | ICD-10-CM | POA: Insufficient documentation

## 2017-07-19 MED ORDER — LISINOPRIL 10 MG PO TABS: 10.0000 mg | ORAL_TABLET | Freq: Every day | ORAL | 3 refills | Status: DC

## 2017-07-19 NOTE — Progress Notes (Signed)
Cardiology Office Note   Date:  07/19/2017   ID:  [name], DOB 1970/09/06, MRN [id number]  PCP:  Lizbeth Bark, FNP    No chief complaint on file.  HTN  Wt Readings from Last 3 Encounters:  07/19/17 152 lb (68.9 kg)  05/11/17 153 lb 9.6 oz (69.7 kg)  05/01/17 145 lb (65.8 kg)       History of Present Illness: [name] is a 47 y.o. female who is being seen today for the evaluation of HTN, LVH at the request of Hairston, Mandesia R, F*.  SHe has had difficult to control HTN.  She was found to have LVH by echo.  She was diagnosed with HTN since 2005, at the time of childbirth.  She was on meds for a while but then stopped them.  She tried home remedies without success.   Denies : Chest pain. Dizziness. Leg edema. Nitroglycerin use. Orthopnea. Palpitations. Paroxysmal nocturnal dyspnea. Shortness of breath. Syncope.   She has had a few episodes of very high BP prompting visits to ER.  She is essentially asymptomatic when BP is high.  SHe is decreasing salt intake and increasing exercise.    Mother with HTN.  Sister had it as well, but died from MI at age 61 in the setting of several other surgeries.  Sister was also a smoker.     Past Medical History:  Diagnosis Date  . Hypertension     Past Surgical History:  Procedure Laterality Date  . CESAREAN SECTION       Current Outpatient Medications  Medication Sig Dispense Refill  . amLODipine (NORVASC) 10 MG tablet Take 1 tablet (10 mg total) by mouth daily. 30 tablet 2  . hydrochlorothiazide (HYDRODIURIL) 12.5 MG tablet Take 1 tablet (12.5 mg total) by mouth daily. 30 tablet 1   No current facility-administered medications for this visit.     Allergies:   Patient has no known allergies.    Social History:  The patient  reports that  has never smoked. she has never used smokeless tobacco. She reports that she drinks alcohol. She reports that she uses drugs. Drug: Marijuana. Frequency: 2.00  times per week.   Family History:  The patient's family history includes Diabetes Mellitus II in her father and mother; Hypertension in her father, mother, and sister; Lung cancer in her other.    ROS:  Please see the history of present illness.   Otherwise, review of systems are positive for increased BP readings.   All other systems are reviewed and negative.    PHYSICAL EXAM: VS:  BP (!) 162/100 (BP Location: Right Arm, Patient Position: Sitting, Cuff Size: Normal)   Pulse 78   Ht 5\' 6"  (1.676 m)   Wt 152 lb (68.9 kg)   SpO2 97%   BMI 24.53 kg/m  , BMI Body mass index is 24.53 kg/m. GEN: Well nourished, well developed, in no acute distress  HEENT: normal  Neck: no JVD, carotid bruits, or masses Cardiac: RRR; no murmurs, rubs, or gallops,no edema  Respiratory:  clear to auscultation bilaterally, normal work of breathing GI: soft, nontender, nondistended, + BS MS: no deformity or atrophy  Skin: warm and dry, no rash Neuro:  Strength and sensation are intact Psych: euthymic mood, full affect   EKG:   The ekg ordered today demonstrates NSR, septal Q waves, nonspecific ST segment changes   Recent Labs: 05/02/2017: Hemoglobin 13.2; Platelets 286 05/11/2017: ALT 12; BUN 16; Creatinine,  Ser 0.97; Potassium 4.2; Sodium 136   Lipid Panel    Component Value Date/Time   CHOL 142 05/11/2017 1558   TRIG 56 05/11/2017 1558   HDL 67 05/11/2017 1558   CHOLHDL 2.1 05/11/2017 1558   LDLCALC 64 05/11/2017 1558     Other studies Reviewed: Additional studies/ records that were reviewed today with results demonstrating: echo from 1/19 reviewed.   ASSESSMENT AND PLAN:  1. HTN: Blood pressure readings still elevated.  She was concerned that her amlodipine is been recalled.  I asked her to contact her pharmacy to see if this was affected by her recall.  She will start lisinopril 10 mg daily in addition to her HCTZ and amlodipine.  This could be uptitrated as long as her electrolytes  remained stable.  We will check electrolytes in a week.  Follow-up in our hypertension clinic. 2. LVH: Noted by echocardiogram.  Hopefully, ACE inhibitor will reverse some of the LVH.  I stressed the importance of lifestyle changes as well for blood pressure control including minimizing salt and increasing exercise to the target noted below. 3. Prior lacunar stroke on MRI: Likely related to HTN.   Current medicines are reviewed at length with the patient today.  The patient concerns regarding her medicines were addressed.  The following changes have been made:  As above  Labs/ tests ordered today include:  No orders of the defined types were placed in this encounter.   Recommend 150 minutes/week of aerobic exercise Low fat, low carb, high fiber diet recommended  Disposition:   FU in 1-2 weeks in HTN clinic   Signed, Lance MussJayadeep Doniel Maiello, MD  07/19/2017 1:57 PM    [facility] [address], [place], [place]  [address] Phone: [phone]; Fax: [phone]

## 2017-07-19 NOTE — Patient Instructions (Signed)
Medication Instructions:  Your physician has recommended you make the following change in your medication:   START: lisinopril 10 mg daily    Labwork: Your physician recommends that you return for lab work in: 1 week for BMET   Testing/Procedures: None ordered  Follow-Up: Your physician recommends that you schedule a follow-up appointment in: the Hypertension Clinic in 1-2 weeks    Any Other Special Instructions Will Be Listed Below (If Applicable).     If you need a refill on your cardiac medications before your next appointment, please call your pharmacy.

## 2017-07-27 ENCOUNTER — Other Ambulatory Visit: Payer: Self-pay

## 2017-07-27 ENCOUNTER — Telehealth: Payer: Self-pay | Admitting: Family Medicine

## 2017-07-27 MED ORDER — HYDROCHLOROTHIAZIDE 12.5 MG PO TABS: 12.5000 mg | ORAL_TABLET | Freq: Every day | ORAL | 2 refills | Status: DC

## 2017-07-27 NOTE — Telephone Encounter (Signed)
Pt called to request a refill for  hydrochlorothiazide (HYDRODIURIL) 12.5 MG tablet   [facility] [address] [place], [place] - [address] Please follow up

## 2017-07-27 NOTE — Telephone Encounter (Signed)
Refilled

## 2017-08-07 ENCOUNTER — Ambulatory Visit: Payer: Self-pay | Admitting: Pharmacist

## 2017-08-07 NOTE — Progress Notes (Deleted)
Patient ID: [name]                 DOB: 10-Oct-1970                      MRN: [id number]     HPI: [name] is a 47 y.o. female referred by Dr. Eldridge DaceVaranasi to HTN clinic. PMH is significant for longstanding HTN, prior lacunar stroke on MRI, and LVH. BP was elevated at 162/100 at visit 3 weeks ago and lisinopril 10mg  daily was started.  Did not show for BMET check, check today Increase HCTZ or lisinopril  She was diagnosed with HTN in 2005 after the birth of her child. She was on medication for a while but then stopped therapy. She tried home remedies without success. She had a few episodes of high BP prompting visits to the ER. She has been asymptomatic when BP is high. She is decreasing salt intake and increasing exercise.  Current HTN meds: amlodipine 10mg  daily, HCTZ 12.5mg  daily, lisinopril 10mg  daily Previously tried:  BP goal: <130/3880mmHg  Family History: Mother, father and sister with HTN, sister died from MI at age 152. Mother and father with DM.  Social History: The patient  reports that  has never smoked. she has never used smokeless tobacco. She reports that she drinks alcohol. She reports that she uses drugs. Drug: Marijuana. Frequency: 2.00 times per week.   Diet:   Exercise:   Home BP readings:   Wt Readings from Last 3 Encounters:  07/19/17 152 lb (68.9 kg)  05/11/17 153 lb 9.6 oz (69.7 kg)  05/01/17 145 lb (65.8 kg)   BP Readings from Last 3 Encounters:  07/19/17 (!) 162/100  05/11/17 (!) 169/109  05/02/17 (!) 194/129   Pulse Readings from Last 3 Encounters:  07/19/17 78  05/11/17 89  05/02/17 66    Renal function: CrCl cannot be calculated (Patient's most recent lab result is older than the maximum 21 days allowed.).  Past Medical History:  Diagnosis Date  . Hypertension     Current Outpatient Medications on File Prior to Visit  Medication Sig Dispense Refill  . hydrochlorothiazide (HYDRODIURIL) 12.5 MG tablet Take 1 tablet (12.5 mg  total) by mouth daily. 30 tablet 2  . lisinopril (PRINIVIL,ZESTRIL) 10 MG tablet Take 1 tablet (10 mg total) by mouth daily. 90 tablet 3  . [DISCONTINUED] lisinopril-hydrochlorothiazide (PRINZIDE,ZESTORETIC) 20-12.5 MG per tablet Take 1 tablet by mouth daily. 90 tablet 3   No current facility-administered medications on file prior to visit.     No Known Allergies   Assessment/Plan:  1. Hypertension -

## 2017-08-11 ENCOUNTER — Other Ambulatory Visit: Payer: Self-pay

## 2017-08-11 ENCOUNTER — Ambulatory Visit (HOSPITAL_COMMUNITY)
Admission: EM | Admit: 2017-08-11 | Discharge: 2017-08-11 | Disposition: A | Discharge status: Home / Self Care | Payer: Self-pay | Attending: Family Medicine | Admitting: Family Medicine

## 2017-08-11 ENCOUNTER — Encounter (HOSPITAL_COMMUNITY): Payer: Self-pay | Admitting: *Deleted

## 2017-08-11 DIAGNOSIS — Z041 Encounter for examination and observation following transport accident: Secondary | ICD-10-CM

## 2017-08-11 DIAGNOSIS — M791 Myalgia, unspecified site: Secondary | ICD-10-CM

## 2017-08-11 MED ORDER — MELOXICAM 7.5 MG PO TABS: 7.5000 mg | ORAL_TABLET | Freq: Every day | ORAL | 0 refills | Status: DC

## 2017-08-11 MED ORDER — CYCLOBENZAPRINE HCL 10 MG PO TABS: 5.0000 mg | ORAL_TABLET | Freq: Every evening | ORAL | 0 refills | Status: DC | PRN
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
## 2018-03-04 ENCOUNTER — Telehealth: Payer: Self-pay | Admitting: Interventional Cardiology

## 2018-03-04 NOTE — Telephone Encounter (Signed)
New message    Pt came in this afternoon asking for a note for work about her medication. She said she would pick it up. She needs the note to say her lisinopril makes her drowsy. Please call.

## 2018-03-04 NOTE — Telephone Encounter (Signed)
Left message for patient to call back  

## 2018-03-04 NOTE — Telephone Encounter (Signed)
Patient calling back and requesting a note for work stating that the lisinopril that she is taking makes her drowsy. 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 Patient also has flexeril on her med list. Patient states that she is not taking this med. Patient was seen by Dr. Eldridge Dace on 07/19/17 and lisinopril 10 mg QD was added to her BP regimen. She was instructed to schedule an appointment in the HTN Clinic and return for BMET. The patient did not. Asked patient how her BP has been and she said it has been all over the place, but did not have any readings. Made patient aware that she needs to be seen to have her BP evaluated and determine if she needs her lisinopril changed since it is "causing her to be drowsy and may interfere with her job". Appointment made for patient to see Robbie Lis, PA tomorrow at 3:00 PM.

## 2018-03-05 ENCOUNTER — Ambulatory Visit: Payer: Self-pay | Admitting: Cardiology

## 2018-04-02 ENCOUNTER — Ambulatory Visit: Payer: Self-pay | Admitting: Cardiology

## 2018-04-02 DIAGNOSIS — R0989 Other specified symptoms and signs involving the circulatory and respiratory systems: Secondary | ICD-10-CM

## 2018-04-03 ENCOUNTER — Encounter: Payer: Self-pay | Admitting: Cardiology

## 2018-09-30 IMAGING — CT CT HEAD W/O CM
4 series · 17 of 47 positions shown, 19 images · non-contrast
Comparison: Head CT and brain MRI 11/30/2014

CLINICAL DATA: Acute headache.

EXAM:
CT HEAD WITHOUT CONTRAST
TECHNIQUE: Contiguous axial images were obtained from the base of the skull
through the vertex without intravenous contrast.

[Series 3: head without · axial · non-contrast · 0.43mm/px · z∈[+1011,+1136]mm · 7 of 35 slices shown, 9 images]
[im 5/35  brain]
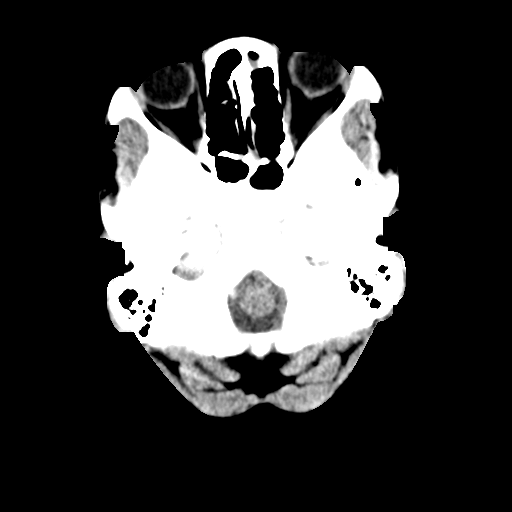
[im 5/35  bone]
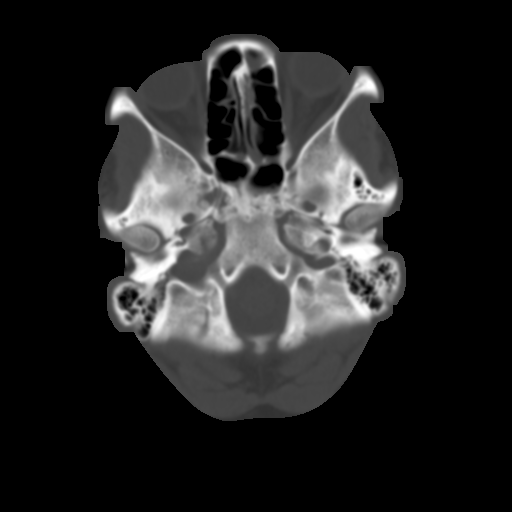
[im 9/35  brain]
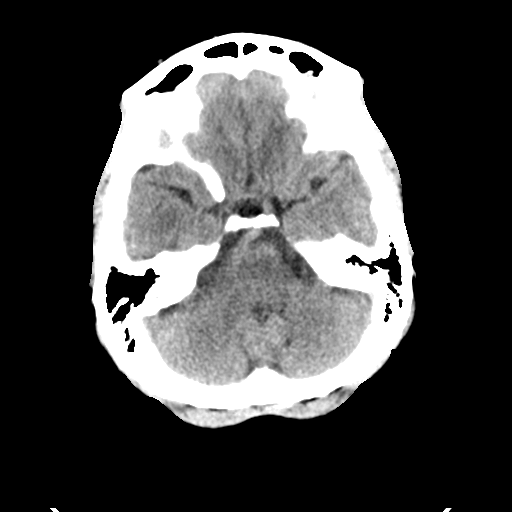
[im 13/35  brain]
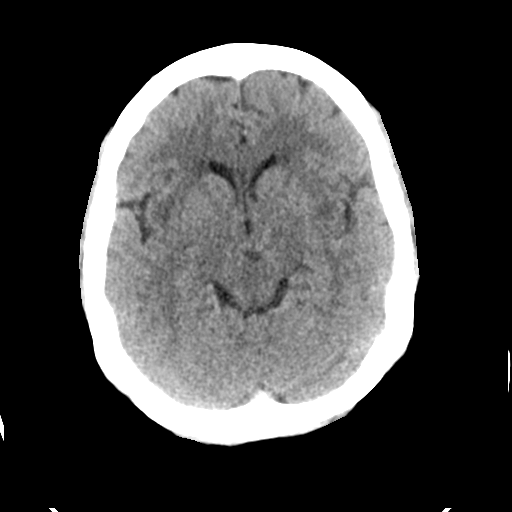
[im 18/35  brain]
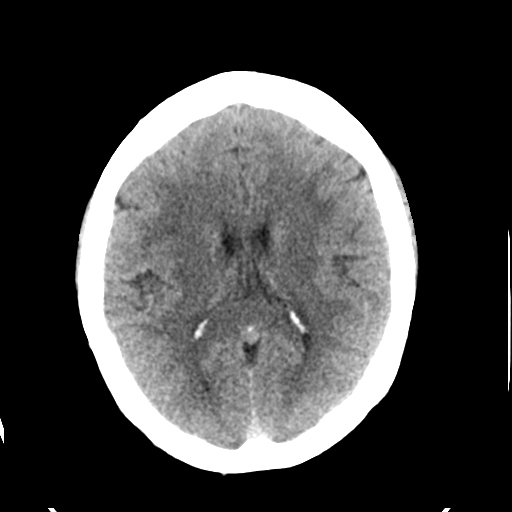
[im 22/35  brain]
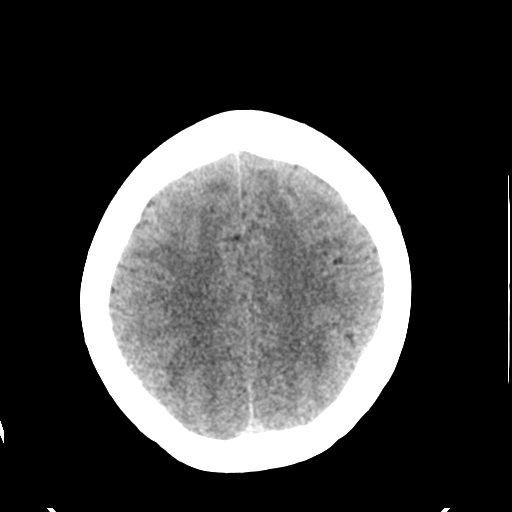
[im 22/35  bone]
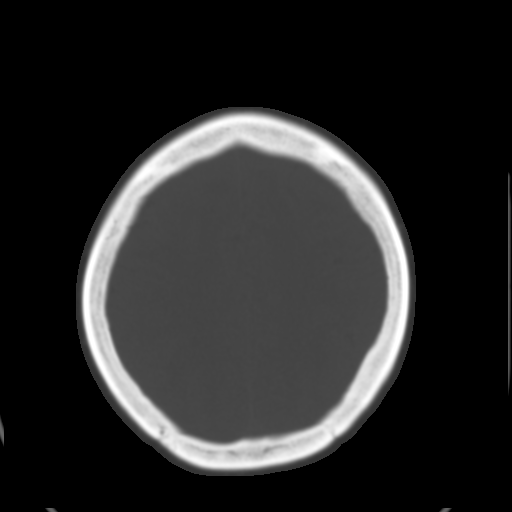
[im 26/35  brain]
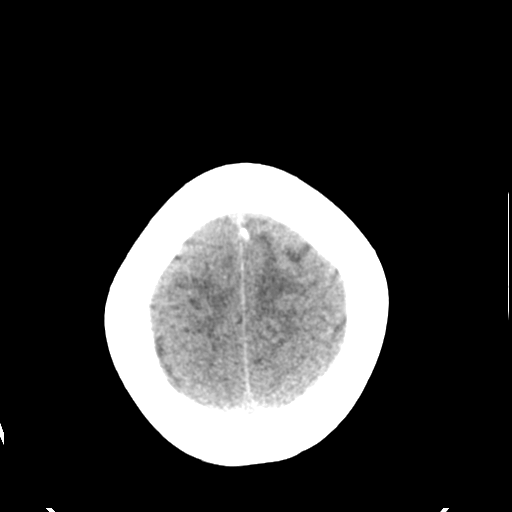
[im 30/35  brain]
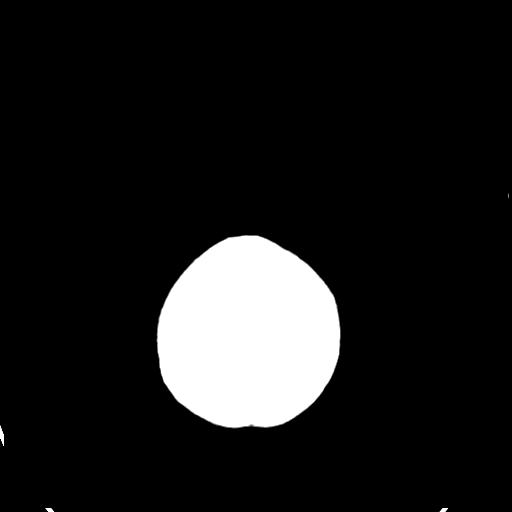

[Series 4: head bone · axial · 0.43mm/px · z∈[+1007,+1067]mm · 4 of 87 slices shown]
[im 9/87  bone]
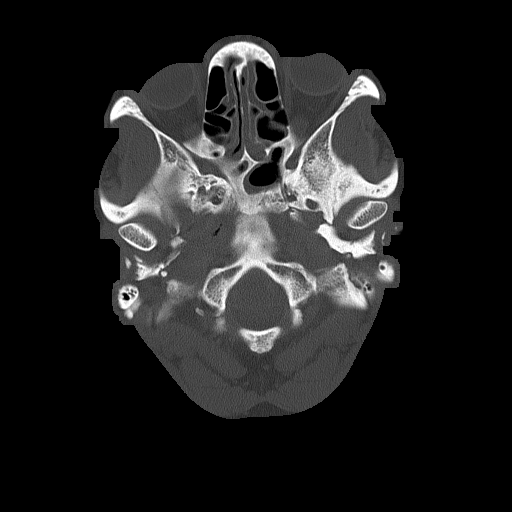
[im 18/87  bone]
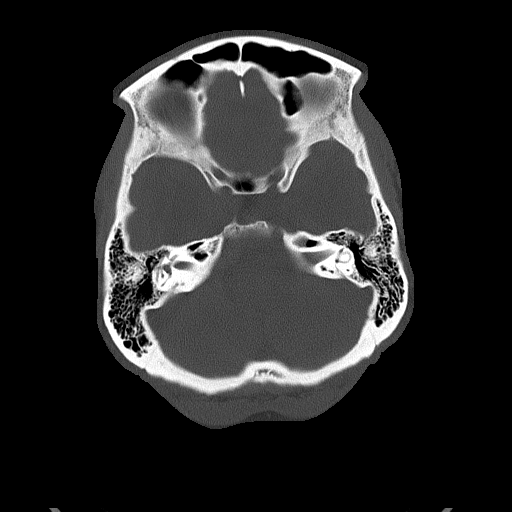
[im 26/87  bone]
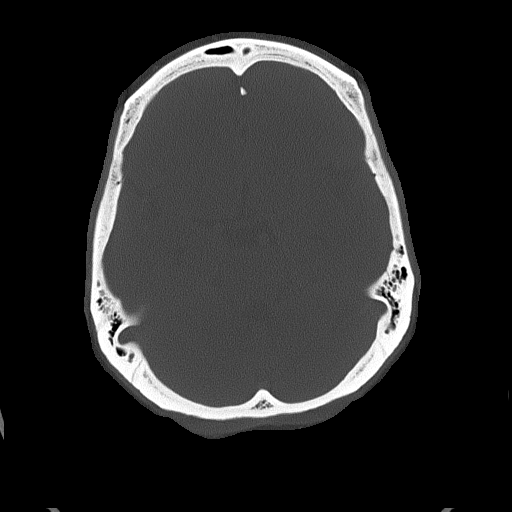
[im 39/87  bone]
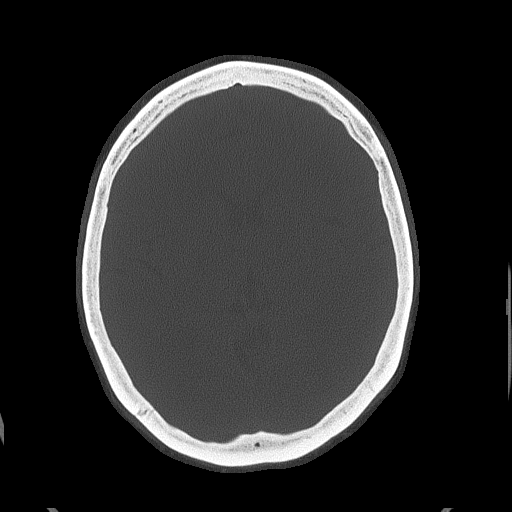

[Series 5: head without cor · coronal · non-contrast · 0.33mm/px · 3 of 64 slices shown]
[im 22/64  brain]
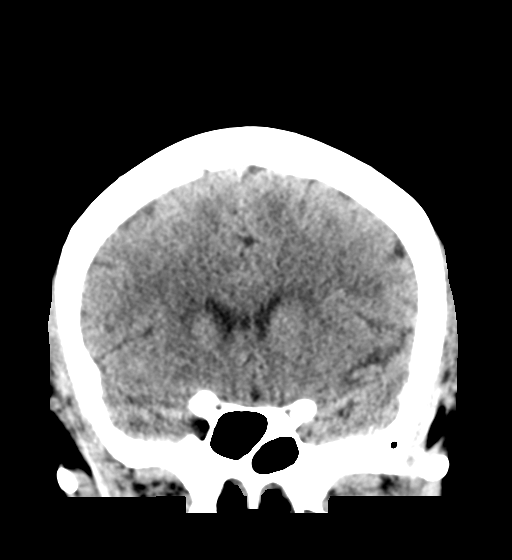
[im 29/64  brain]
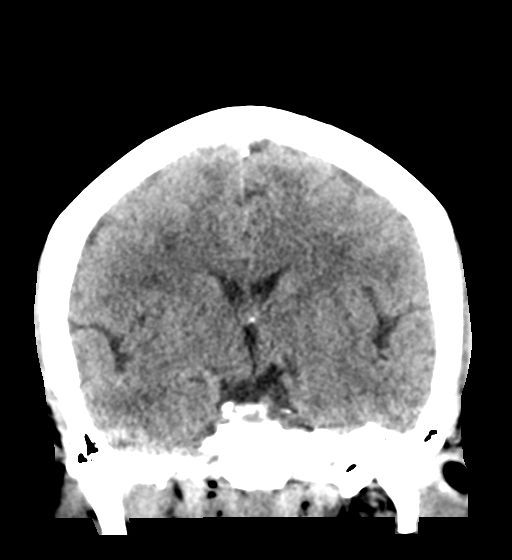
[im 36/64  brain]
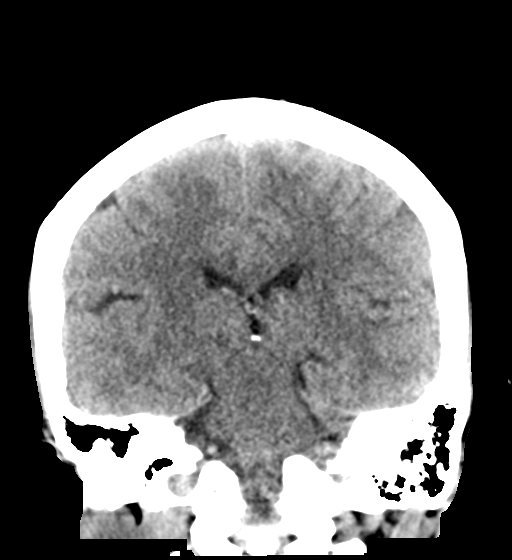

[Series 6: head without sag · sagittal · non-contrast · 0.36mm/px · 3 of 62 slices shown]
[im 21/62  brain]
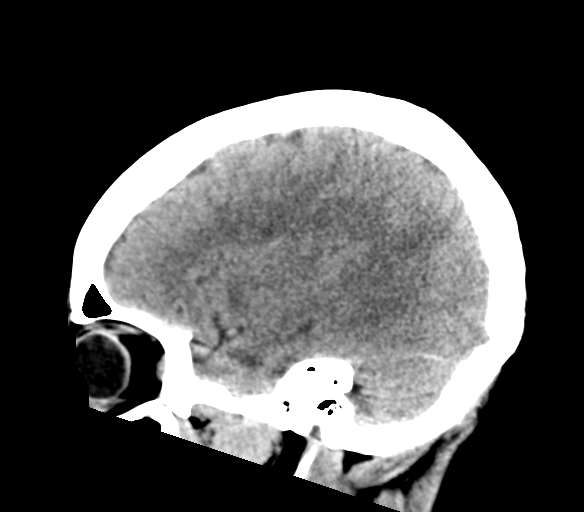
[im 31/62  brain]
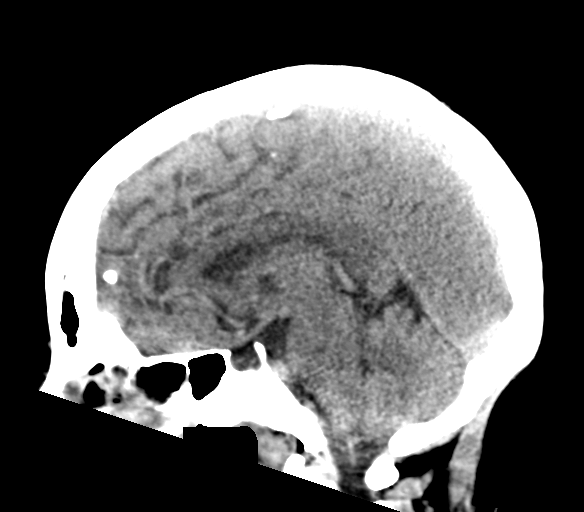
[im 41/62  brain]
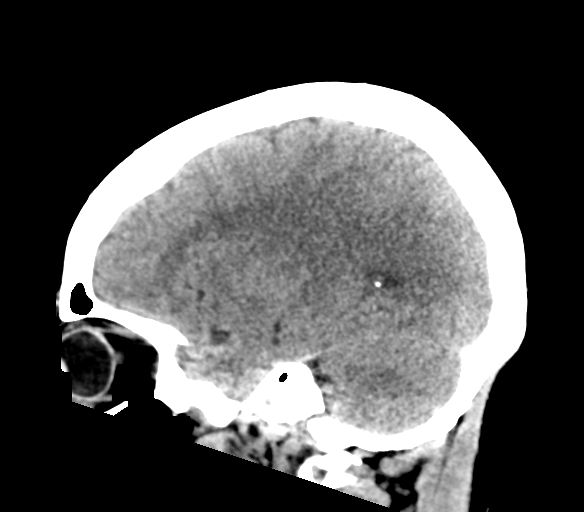

[17 of 47 positions shown; findings below may reference images not displayed]

FINDINGS: Brain: Similar chronic small vessel ischemia and atrophy to prior
exam. No intracranial hemorrhage, mass effect, or midline shift. No
hydrocephalus. The basilar cisterns are patent. No evidence of
territorial infarct or acute ischemia. No extra-axial or
intracranial fluid collection.

Vascular: No hyperdense vessel or unexpected calcification.

Skull: No fracture or focal lesion.

Sinuses/Orbits: Chronic paranasal sinus inflammation with mucosal
thickening on bubbly debris. No acute finding.

Other: None.
IMPRESSION: 1.  No acute intracranial abnormality.
2. Atrophy and chronic small vessel ischemia, unchanged.

## 2019-07-09 ENCOUNTER — Ambulatory Visit: Payer: Self-pay | Admitting: Family Medicine

## 2019-07-18 ENCOUNTER — Encounter: Payer: Self-pay | Admitting: Family Medicine

## 2019-07-18 ENCOUNTER — Other Ambulatory Visit: Payer: Self-pay

## 2019-07-18 ENCOUNTER — Ambulatory Visit: Payer: Self-pay | Attending: Family Medicine | Admitting: Family Medicine

## 2019-07-18 VITALS — BP 214/126 | HR 74 | Temp 97.7°F | Ht 67.0 in | Wt 139.0 lb

## 2019-07-18 DIAGNOSIS — Z91148 Patient's other noncompliance with medication regimen for other reason: Secondary | ICD-10-CM

## 2019-07-18 DIAGNOSIS — I6789 Other cerebrovascular disease: Secondary | ICD-10-CM

## 2019-07-18 DIAGNOSIS — Z9114 Patient's other noncompliance with medication regimen: Secondary | ICD-10-CM

## 2019-07-18 DIAGNOSIS — I119 Hypertensive heart disease without heart failure: Secondary | ICD-10-CM

## 2019-07-18 DIAGNOSIS — Z8673 Personal history of transient ischemic attack (TIA), and cerebral infarction without residual deficits: Secondary | ICD-10-CM

## 2019-07-18 DIAGNOSIS — I160 Hypertensive urgency: Secondary | ICD-10-CM

## 2019-07-18 MED ORDER — CLONIDINE HCL 0.2 MG PO TABS
0.2000 mg | ORAL_TABLET | Freq: Every day | ORAL | Status: AC
  Administered 2019-07-18: 17:00:00 0.2 mg via ORAL

## 2019-07-18 MED ORDER — AMLODIPINE BESYLATE 10 MG PO TABS: 10.0000 mg | ORAL_TABLET | Freq: Every day | ORAL | 1 refills | Status: DC

## 2019-07-18 MED ORDER — LOSARTAN POTASSIUM-HCTZ 50-12.5 MG PO TABS: 1.0000 | ORAL_TABLET | Freq: Every day | ORAL | 1 refills | Status: DC

## 2019-07-18 NOTE — Patient Instructions (Addendum)
Please pick up your blood pressure medications today and take them as soon as you can. Go to the Emergency Department if you are not feeling well or if your blood pressure remains elevated/high   Lacunar Stroke  A stroke is the sudden death of brain tissue that occurs when an area of the brain does not get enough oxygen. A lacunar stroke (lacunar infarction) is caused by a blockage in one of the small arteries deep in the brain. Lacunar stroke is a medical emergency that must be treated right away.It is important to get help right away as soon as you notice symptoms of stroke. What are the causes? A lacunar stroke occurs when small arteries deep in the brain become more narrow due to a buildup of fatty deposits in the arteries (atherosclerosis). When the arteries narrow, less blood flows to certain areas of the brain. Without enough blood and oxygen, these parts of the brain can die or become permanently damaged. What increases the risk? You are more likely to develop this condition if:  You have high blood pressure (hypertension).  You have high cholesterol (hyperlipidemia).  You smoke.  You have diabetes.  You are obese.  You have an unhealthy diet. This includes foods that are high in saturated fat, trans fat, and salt (sodium).  You have a heart rhythm disorder (atrial fibrillation).  You have heart disease.  You have artery disease, such as carotid artery disease or peripheral artery disease.  You have sickle cell disease.  You are age 51 or older.  You have a personal or family history of stroke.  You are African-American.  You are a woman who: ? Is pregnant. ? Has a history of diabetes during pregnancy (gestational diabetes). ? Has a history of preeclampsia or eclampsia. ? Has had hormone therapy after menopause. ? Uses oral birth control (contraception), especially while smoking. What are the signs or symptoms? Symptoms of this condition usually develop suddenly.  They may include:  Weakness or numbness in your face, arm, or leg, especially on one side of your body.  Trouble walking or difficulty moving your arms or legs.  Loss of balance or coordination.  Confusion.  Slurred speech (dysarthria).  Trouble speaking, understanding speech, or both (aphasia).  Vision problems in one or both eyes.  Dizziness.  Nausea and vomiting.  Severe headache with no known cause. How is this diagnosed? This condition may be diagnosed based on:  Your symptoms and medical history.  A physical exam.  Blood tests.  A CT scan of the brain.  MRI.  Ultrasound of an artery. This may help find blood flow problems or blockages.  Angiogram. During this test, dye is injected into your blood and then an X-ray is done to look for blockages. The dye helps blood flow and blockages show up clearly on X-rays.  Electroencephalogram (EEG). This test checks electrical activity in the brain. How is this treated? This condition must be treated within 4.5 hours of the start of the stroke. It is treated with IV medicine that dissolves the blood clot (tissue plasminogen activator, TPA) that is causing the blockage. The goal is to restore blood flow to the brain as soon as possible. Your health care provider may prescribe blood thinners (antiplatelets or anticoagulants) to lower your risk of another stroke. Follow these instructions at home: Medicines  Take over-the-counter and other prescription medicines only as told by your health care provider. This includes diabetes or cholesterol medicine.  If you were told to  take a medicine to thin your blood, such as aspirin, take it exactly as told by your health care provider. ? Taking too much blood-thinning medicine can cause bleeding. ? If you do not take enough blood-thinning medicine, you will not have the protection that you need against another stroke and other problems. Eating and drinking  Follow instructions from  your health care provider about eating and drinking restrictions.  Eat a healthy diet. This includes plenty of fruits and vegetables, lean meats, whole grains, and low-fat dairy products.  Avoid foods high in saturated fat, trans fat, or sodium.  Limit alcohol intake to no more than 1 drink a day for nonpregnant women and 2 drinks a day for men. One drink equals 12 oz of beer, 5 oz of wine, or 1 oz of hard liquor. Safety  If you need help walking, use a cane or walker as told by your health care provider.  Take steps to lower the risk of falls in your home. This may include: ? Using safety equipment, such as raised toilets and a seat in the shower. ? Removing clutter and tripping hazards from walkways, such as cords or rugs. ? Installing grab bars in the bedroom and bathroom. Activity  Exercise regularly, as told by your health care provider.  Take part in rehabilitation programs as told by your health care provider. This may include physical therapy, occupational therapy, or speech therapy. General instructions  Do not use any tobacco products, such as cigarettes, chewing tobacco, and e-cigarettes. If you need help quitting, ask your health care provider.  Keep all follow-up visits as told by your health care provider. This is important. Get help right away if:   You have any symptoms of stroke. "BE FAST" is an easy way to remember the main warning signs of stroke: ? B - Balance. Signs are dizziness, sudden trouble walking, or loss of balance. ? E - Eyes. Signs are trouble seeing or a sudden change in vision. ? F - Face. Signs are sudden weakness or numbness of the face, or the face or eyelid drooping on one side. ? A - Arms. Signs are weakness or numbness in an arm. This happens suddenly and usually on one side of the body. ? S - Speech. Signs are sudden trouble speaking, slurred speech, or trouble understanding what people say. ? T - Time. Time to call emergency services. Write  down what time symptoms started.  You have other signs of stroke, such as: ? A sudden, severe headache with no known cause. ? Nausea or vomiting. ? Seizure.  You have a severe fall or injury. These symptoms may represent a serious problem that is an emergency. Do not wait to see if the symptoms will go away. Get medical help right away. Call your local emergency services (911 in the [place] Do not drive yourself to the hospital. Summary  A lacunar stroke (lacunar infarction) is a blockage of one of the small arteries deep in the brain. When one of these arteries is blocked, parts of the brain do not get enough oxygen and may die.  This condition is a medical emergency that must be treated right away. Treatments must be done within 4.5 hours of the start of the stroke.  Controlling your risk factors for stroke is the best way to avoid another lacunar stroke.  Get help right away if you have any symptoms of stroke. "BE FAST" is an easy way to remember the main warning signs of stroke.  This information is not intended to replace advice given to you by your health care provider. Make sure you discuss any questions you have with your health care provider. Document Revised: 02/08/2018 Document Reviewed: 10/06/2016 Elsevier Patient Education  [place].  Hypertension, Adult Hypertension is another name for high blood pressure. High blood pressure forces your heart to work harder to pump blood. This can cause problems over time. There are two numbers in a blood pressure reading. There is a top number (systolic) over a bottom number (diastolic). It is best to have a blood pressure that is below 120/80. Healthy choices can help lower your blood pressure, or you may need medicine to help lower it. What are the causes? The cause of this condition is not known. Some conditions may be related to high blood pressure. What increases the risk?  Smoking.  Having type 2 diabetes mellitus, high  cholesterol, or both.  Not getting enough exercise or physical activity.  Being overweight.  Having too much fat, sugar, calories, or salt (sodium) in your diet.  Drinking too much alcohol.  Having long-term (chronic) kidney disease.  Having a family history of high blood pressure.  Age. Risk increases with age.  Race. You may be at higher risk if you are African American.  Gender. Men are at higher risk than women before age 6. After age 4, women are at higher risk than men.  Having obstructive sleep apnea.  Stress. What are the signs or symptoms?  High blood pressure may not cause symptoms. Very high blood pressure (hypertensive crisis) may cause: ? Headache. ? Feelings of worry or nervousness (anxiety). ? Shortness of breath. ? Nosebleed. ? A feeling of being sick to your stomach (nausea). ? Throwing up (vomiting). ? Changes in how you see. ? Very bad chest pain. ? Seizures. How is this treated?  This condition is treated by making healthy lifestyle changes, such as: ? Eating healthy foods. ? Exercising more. ? Drinking less alcohol.  Your health care provider may prescribe medicine if lifestyle changes are not enough to get your blood pressure under control, and if: ? Your top number is above 130. ? Your bottom number is above 80.  Your personal target blood pressure may vary. Follow these instructions at home: Eating and drinking   If told, follow the DASH eating plan. To follow this plan: ? Fill one half of your plate at each meal with fruits and vegetables. ? Fill one fourth of your plate at each meal with whole grains. Whole grains include whole-wheat pasta, brown rice, and whole-grain bread. ? Eat or drink low-fat dairy products, such as skim milk or low-fat yogurt. ? Fill one fourth of your plate at each meal with low-fat (lean) proteins. Low-fat proteins include fish, chicken without skin, eggs, beans, and tofu. ? Avoid fatty meat, cured and  processed meat, or chicken with skin. ? Avoid pre-made or processed food.  Eat less than 1,500 mg of salt each day.  Do not drink alcohol if: ? Your doctor tells you not to drink. ? You are pregnant, may be pregnant, or are planning to become pregnant.  If you drink alcohol: ? Limit how much you use to:  0-1 drink a day for women.  0-2 drinks a day for men. ? Be aware of how much alcohol is in your drink. In the [place] one drink equals one 12 oz bottle of beer (355 mL), one 5 oz glass of wine (148 mL), or one  1 oz glass of hard liquor (44 mL). Lifestyle   Work with your doctor to stay at a healthy weight or to lose weight. Ask your doctor what the best weight is for you.  Get at least 30 minutes of exercise most days of the week. This may include walking, swimming, or biking.  Get at least 30 minutes of exercise that strengthens your muscles (resistance exercise) at least 3 days a week. This may include lifting weights or doing Pilates.  Do not use any products that contain nicotine or tobacco, such as cigarettes, e-cigarettes, and chewing tobacco. If you need help quitting, ask your doctor.  Check your blood pressure at home as told by your doctor.  Keep all follow-up visits as told by your doctor. This is important. Medicines  Take over-the-counter and prescription medicines only as told by your doctor. Follow directions carefully.  Do not skip doses of blood pressure medicine. The medicine does not work as well if you skip doses. Skipping doses also puts you at risk for problems.  Ask your doctor about side effects or reactions to medicines that you should watch for. Contact a doctor if you:  Think you are having a reaction to the medicine you are taking.  Have headaches that keep coming back (recurring).  Feel dizzy.  Have swelling in your ankles.  Have trouble with your vision. Get help right away if you:  Get a very bad headache.  Start to feel mixed up  (confused).  Feel weak or numb.  Feel faint.  Have very bad pain in your: ? Chest. ? Belly (abdomen).  Throw up more than once.  Have trouble breathing. Summary  Hypertension is another name for high blood pressure.  High blood pressure forces your heart to work harder to pump blood.  For most people, a normal blood pressure is less than 120/80.  Making healthy choices can help lower blood pressure. If your blood pressure does not get lower with healthy choices, you may need to take medicine. This information is not intended to replace advice given to you by your health care provider. Make sure you discuss any questions you have with your health care provider. Document Revised: 01/30/2018 Document Reviewed: 01/30/2018 Elsevier Patient Education  2020 [employer].
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
## 2019-07-19 LAB — BASIC METABOLIC PANEL WITH GFR
BUN/Creatinine Ratio: 19 (ref 9–23)
BUN: 21 mg/dL (ref 6–24)
CO2: 23 mmol/L (ref 20–29)
Calcium: 10.1 mg/dL (ref 8.7–10.2)
Chloride: 104 mmol/L (ref 96–106)
Creatinine, Ser: 1.13 mg/dL — ABNORMAL HIGH (ref 0.57–1.00)
GFR calc Af Amer: 65 mL/min/1.73
GFR calc non Af Amer: 57 mL/min/1.73 — ABNORMAL LOW
Glucose: 95 mg/dL (ref 65–99)
Potassium: 3.9 mmol/L (ref 3.5–5.2)
Sodium: 142 mmol/L (ref 134–144)

## 2019-07-19 LAB — CBC
Hematocrit: 38.8 % (ref 34.0–46.6)
Hemoglobin: 13 g/dL (ref 11.1–15.9)
MCH: 32.3 pg (ref 26.6–33.0)
MCHC: 33.5 g/dL (ref 31.5–35.7)
MCV: 97 fL (ref 79–97)
Platelets: 265 x10E3/uL (ref 150–450)
RBC: 4.02 x10E6/uL (ref 3.77–5.28)
RDW: 12.6 % (ref 11.7–15.4)
WBC: 6.7 x10E3/uL (ref 3.4–10.8)

## 2019-07-21 ENCOUNTER — Encounter: Payer: Self-pay | Admitting: Neurology

## 2019-07-22 ENCOUNTER — Telehealth (INDEPENDENT_AMBULATORY_CARE_PROVIDER_SITE_OTHER): Payer: Self-pay

## 2019-07-22 NOTE — Telephone Encounter (Signed)
Patient verified date of birth. She is aware of mild increase in creatinine; just above normal range at 1.13. she is also aware that her CBC was normal. She verbalized understanding. Maryjean Morn, CMA

## 2019-07-22 NOTE — Progress Notes (Deleted)
Cardiology Office Note   Date:  07/22/2019   ID:  [name], DOB 23-Jun-1970, MRN [id number]  PCP:  Cain Saupe, MD  Cardiologist:  Dr. Eldridge Dace, MD   No chief complaint on file.     History of Present Illness: [name] is a 49 y.o. female who presents malignant hypertension, seen for Dr. Eldridge Dace.   [name] has a history of uncontrolled hypertension (dx 2005) and a lacunar stroke per MRI. She was initially referred to cardiology service, seen by Dr. Eldridge Dace 07/19/2017.  She had a prior echocardiogram showed a normal LVEF at 60 to 65% with normal wall motion however with severe focal basal and moderate concentric hypertrophy and mild aortic regurgitation.  At that time she had concern regarding a recall on her amlodipine therefore she was started on lisinopril 10 mg p.o. daily in addition to her HCTZ and amlodipine with plans for follow-up in our hypertension clinic.  [facility] most recently [facility], noted to have not been on any blood pressure medications for the last 3 years with a BP at 214/126. It was recommended that she proceed to the ED however the patient refused. She was then given clonidine 0.2mg  and was restarted on Amlodipine 10 along with losartan-HCTZ (Hyzaar) 50-12.5. UA and BMET were drawn. She was then referred back to cardiology for further HTN assistance.        1.  Hypertension:   Lifestyle changes: -Smoking cessation -Control your blood glucose and lipids -Diet: We recommend a diet low in sodium, low in fat and carbohydrates and high in vegetables, fruits and grains.  -Moderate alcohol consumption -Reduce your sodium intake to no more than 2,400mg /day -Physical Activity: Moderate to vigorous activity 3-4 days a week averaging at least 40 min per session.   2.  LVH: -Per echocardiogram from 2018 with normal LV function and severe focal basal and moderate concentric  hypertrophy likely in the setting of longstanding uncontrolled hypertension -BP goal 130/80 mmHg -Plan for repeat echocardiogram  3.  Prior lacunar stroke on MRI: -As above, likely in the setting of longstanding uncontrolled hypertension   Past Medical History:  Diagnosis Date  . Hypertension     Past Surgical History:  Procedure Laterality Date  . CESAREAN SECTION    . TUBAL LIGATION       Current Outpatient Medications  Medication Sig Dispense Refill  . amLODipine (NORVASC) 10 MG tablet Take 1 tablet (10 mg total) by mouth daily. 30 tablet 1  . cyclobenzaprine (FLEXERIL) 10 MG tablet Take 0.5-1 tablets (5-10 mg total) by mouth at bedtime as needed for muscle spasms. (Patient not taking: Reported on 07/18/2019) 10 tablet 0  . hydrochlorothiazide (HYDRODIURIL) 12.5 MG tablet Take 1 tablet (12.5 mg total) by mouth daily. 30 tablet 2  . losartan-hydrochlorothiazide (HYZAAR) 50-12.5 MG tablet Take 1 tablet by mouth daily. 30 tablet 1  . meloxicam (MOBIC) 7.5 MG tablet Take 1 tablet (7.5 mg total) by mouth daily. (Patient not taking: Reported on 07/18/2019) 15 tablet 0   No current facility-administered medications for this visit.    Allergies:   Patient has no known allergies.    Social History:  The patient  reports that she has never smoked. She has never used smokeless tobacco. She reports current alcohol use. She reports current drug use. Frequency: 2.00 times per week. Drug: Marijuana.   Family History:  The patient's ***family history includes Diabetes Mellitus  II in her father and mother; Hypertension in her father, mother, and sister; Lung cancer in an other family member.    ROS:  Please see the history of present illness.   Otherwise, review of systems are positive for {NONE DEFAULTED:18576::"none"}.   All other systems are reviewed and negative.    PHYSICAL EXAM: VS:  LMP 08/07/2017 (Exact Date)  , BMI There is no height or weight on file to calculate BMI. GEN: Well  nourished, well developed, in no acute distress HEENT: normal Neck: no JVD, carotid bruits, or masses Cardiac: ***RRR; no murmurs, rubs, or gallops,no edema  Respiratory:  clear to auscultation bilaterally, normal work of breathing GI: soft, nontender, nondistended, + BS MS: no deformity or atrophy Skin: warm and dry, no rash Neuro:  Strength and sensation are intact Psych: euthymic mood, full affect   EKG:  EKG {ACTION; IS/IS FYB:01751025} ordered today. The ekg ordered today demonstrates ***   Recent Labs: 07/18/2019: BUN 21; Creatinine, Ser 1.13; Hemoglobin 13.0; Platelets 265; Potassium 3.9; Sodium 142    Lipid Panel    Component Value Date/Time   CHOL 142 05/11/2017 1558   TRIG 56 05/11/2017 1558   HDL 67 05/11/2017 1558   CHOLHDL 2.1 05/11/2017 1558   LDLCALC 64 05/11/2017 1558      Wt Readings from Last 3 Encounters:  07/18/19 139 lb (63 kg)  07/19/17 152 lb (68.9 kg)  05/11/17 153 lb 9.6 oz (69.7 kg)      Other studies Reviewed: Additional studies/ records that were reviewed today include: ***. Review of the above records demonstrates: ***  Echocardiogram 05/30/2017:  - Left ventricle: GLLS is normal at -19.5% The cavity size was  normal. There was severe focal basal and moderate concentric  hypertrophy. Systolic function was normal. The estimated ejection  fraction was in the range of 60% to 65%. Wall motion was normal;  there were no regional wall motion abnormalities. Left  ventricular diastolic function parameters were normal.  - Aortic valve: There was mild regurgitation.  - Pulmonary arteries: PA peak pressure: 31 mm Hg (S).   ASSESSMENT AND PLAN:  1.  ***   Current medicines are reviewed at length with the patient today.  The patient {ACTIONS; HAS/DOES NOT HAVE:19233} concerns regarding medicines.  The following changes have been made:  {PLAN; NO CHANGE:13088:s}  Labs/ tests ordered today include: *** No orders of the defined  types were placed in this encounter.    Disposition:   FU with *** in {gen number 8-52:778242} {Days to years:10300}  Signed, Kathyrn Drown, NP  07/22/2019 8:08 AM    [facility] [place], [place], [place]  [address] Phone: [phone]; Fax: [phone]

## 2019-07-22 NOTE — Telephone Encounter (Signed)
-----   Message from Cain Saupe, MD sent at 07/19/2019  4:13 PM EST ----- Mild increase in creatinine at 1.13 which is just above normal (normal creatinine 0.57-1.0).  Normal complete blood count.

## 2019-07-24 ENCOUNTER — Ambulatory Visit: Payer: Self-pay | Admitting: Cardiology

## 2019-07-25 ENCOUNTER — Ambulatory Visit: Payer: Self-pay | Admitting: Pharmacist

## 2019-07-27 NOTE — Progress Notes (Deleted)
Cardiology Office Note   Date:  07/27/2019   ID:  [name], DOB 1970/11/28, MRN [id number]  PCP:  Cain Saupe, MD  Cardiologist:  Georgie Chard, NP   No chief complaint on file.     History of Present Illness: [name] is a 49 y.o. female who presents for malignant hypertension follow up, seen for Dr. Eldridge Dace.   [name] has a history of uncontrolled hypertension (dx 2005) and a lacunar stroke per MRI. She was initially referred to cardiology service, seen by Dr. Eldridge Dace 07/19/2017. She had a prior echocardiogram showed a normal LVEF at 60 to 65% with normal wall motion however with severe focal basal and moderate concentric hypertrophy and mild aortic regurgitation. At that time she had concern regarding a recall on her amlodipine therefore she was started on lisinopril 10 mg p.o. daily in addition to her HCTZ and amlodipine with plans for follow-up in our hypertension clinic.   She was most recently [facility], noted to have not been on any blood pressure medications for the last 3 years with a BP at 214/126. It was recommended that she proceed to the ED however the patient refused. She was then given clonidine 0.2mg  and was restarted on Amlodipine 10 along with losartan-HCTZ (Hyzaar) 50-12.5. UA and BMET were drawn. She was then referred back to cardiology for further HTN assistance.        1. Hypertension:    Lifestyle changes:  -Smoking cessation  -Control your blood glucose and lipids  -Diet: We recommend a diet low in sodium, low in fat and carbohydrates and high in vegetables, fruits and grains.  -Moderate alcohol consumption  -Reduce your sodium intake to no more than 2,400mg /day  -Physical Activity: Moderate to vigorous activity 3-4 days a week averaging at least 40 min per session.   2. LVH:  -Per echocardiogram from 2018 with normal LV function and severe focal basal  and moderate concentric hypertrophy likely in the setting of longstanding uncontrolled hypertension  -BP goal 130/80 mmHg  -Plan for repeat echocardiogram   3. Prior lacunar stroke on MRI:  -As above, likely in the setting of longstanding uncontrolled hypertension      Past Medical History:  Diagnosis Date  . Hypertension     Past Surgical History:  Procedure Laterality Date  . CESAREAN SECTION    . TUBAL LIGATION       Current Outpatient Medications  Medication Sig Dispense Refill  . amLODipine (NORVASC) 10 MG tablet Take 1 tablet (10 mg total) by mouth daily. 30 tablet 1  . cyclobenzaprine (FLEXERIL) 10 MG tablet Take 0.5-1 tablets (5-10 mg total) by mouth at bedtime as needed for muscle spasms. (Patient not taking: Reported on 07/18/2019) 10 tablet 0  . hydrochlorothiazide (HYDRODIURIL) 12.5 MG tablet Take 1 tablet (12.5 mg total) by mouth daily. 30 tablet 2  . losartan-hydrochlorothiazide (HYZAAR) 50-12.5 MG tablet Take 1 tablet by mouth daily. 30 tablet 1  . meloxicam (MOBIC) 7.5 MG tablet Take 1 tablet (7.5 mg total) by mouth daily. (Patient not taking: Reported on 07/18/2019) 15 tablet 0   No current facility-administered medications for this visit.    Allergies:   Patient has no known allergies.    Social History:  The patient  reports that she has never smoked. She has never used smokeless tobacco. She reports current alcohol use. She reports current drug use. Frequency: 2.00 times per week. Drug:  Marijuana.   Family History:  The patient's ***family history includes Diabetes Mellitus II in her father and mother; Hypertension in her father, mother, and sister; Lung cancer in an other family member.    ROS:  Please see the history of present illness.   Otherwise, review of systems are positive for {NONE DEFAULTED:18576::"none"}.   All other systems are reviewed and negative.    PHYSICAL EXAM: VS:  LMP 08/07/2017 (Exact Date)  , BMI There is no height or weight on  file to calculate BMI. GEN: Well nourished, well developed, in no acute distress HEENT: normal Neck: no JVD, carotid bruits, or masses Cardiac: ***RRR; no murmurs, rubs, or gallops,no edema  Respiratory:  clear to auscultation bilaterally, normal work of breathing GI: soft, nontender, nondistended, + BS MS: no deformity or atrophy Skin: warm and dry, no rash Neuro:  Strength and sensation are intact Psych: euthymic mood, full affect   EKG:  EKG {ACTION; IS/IS BBC:[id number]} ordered today. The ekg ordered today demonstrates ***   Recent Labs: 07/18/2019: BUN 21; Creatinine, Ser 1.13; Hemoglobin 13.0; Platelets 265; Potassium 3.9; Sodium 142    Lipid Panel    Component Value Date/Time   CHOL 142 05/11/2017 1558   TRIG 56 05/11/2017 1558   HDL 67 05/11/2017 1558   CHOLHDL 2.1 05/11/2017 1558   LDLCALC 64 05/11/2017 1558      Wt Readings from Last 3 Encounters:  07/18/19 139 lb (63 kg)  07/19/17 152 lb (68.9 kg)  05/11/17 153 lb 9.6 oz (69.7 kg)      Other studies Reviewed: Additional studies/ records that were reviewed today include: ***. Review of the above records demonstrates: ***  Echocardiogram 05/30/2017:   - Left ventricle: GLLS is normal at -19.5% The cavity size was  normal. There was severe focal basal and moderate concentric  hypertrophy. Systolic function was normal. The estimated ejection  fraction was in the range of 60% to 65%. Wall motion was normal;  there were no regional wall motion abnormalities. Left  ventricular diastolic function parameters were normal.  - Aortic valve: There was mild regurgitation.  - Pulmonary arteries: PA peak pressure: 31 mm Hg (S).     ASSESSMENT AND PLAN:  1.  ***   Current medicines are reviewed at length with the patient today.  The patient {ACTIONS; HAS/DOES NOT HAVE:19233} concerns regarding medicines.  The following changes have been made:  {PLAN; NO CHANGE:13088:s}  Labs/ tests ordered today  include: *** No orders of the defined types were placed in this encounter.    Disposition:   FU with *** in {gen number 4-50:388828} {Days to years:10300}  Signed, Kathyrn Drown, NP  07/27/2019 9:19 AM    [facility] [place], [place], [place]  [address] Phone: [phone]; Fax: [phone]

## 2019-08-06 ENCOUNTER — Ambulatory Visit: Payer: Self-pay | Admitting: Cardiology

## 2019-08-19 ENCOUNTER — Encounter: Payer: Self-pay | Admitting: Family Medicine
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
## 2019-09-09 ENCOUNTER — Ambulatory Visit: Payer: Self-pay | Admitting: Neurology

## 2019-09-15 ENCOUNTER — Telehealth: Payer: Self-pay | Admitting: Pharmacist

## 2019-09-15 NOTE — Telephone Encounter (Signed)
Contacted by patient's PCP to schedule patient for BP check. She was a no-show in Feb. Will forward to scheduling.

## 2019-09-16 ENCOUNTER — Telehealth: Payer: Self-pay | Admitting: Family Medicine

## 2019-09-16 NOTE — Telephone Encounter (Signed)
Called patient and LVM to return call and schedule an appt with Luke.  

## 2019-11-03 ENCOUNTER — Other Ambulatory Visit: Payer: Self-pay | Admitting: Family Medicine

## 2019-11-03 DIAGNOSIS — I119 Hypertensive heart disease without heart failure: Secondary | ICD-10-CM

## 2019-11-03 DIAGNOSIS — I160 Hypertensive urgency: Secondary | ICD-10-CM

## 2020-04-27 ENCOUNTER — Ambulatory Visit
Admission: EM | Admit: 2020-04-27 | Discharge: 2020-04-27 | Disposition: A | Discharge status: Home / Self Care | Payer: Self-pay | Attending: Emergency Medicine | Admitting: Emergency Medicine

## 2020-04-27 ENCOUNTER — Other Ambulatory Visit: Payer: Self-pay

## 2020-04-27 DIAGNOSIS — Z1152 Encounter for screening for COVID-19: Secondary | ICD-10-CM

## 2020-04-27 MED ORDER — BENZONATATE 100 MG PO CAPS: 100.0000 mg | ORAL_CAPSULE | Freq: Three times a day (TID) | ORAL | 0 refills | Status: AC | PRN

## 2020-04-27 MED ORDER — CETIRIZINE HCL 10 MG PO TABS: 10.0000 mg | ORAL_TABLET | Freq: Every day | ORAL | 0 refills | Status: DC
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
## 2020-04-27 NOTE — ED Triage Notes (Signed)
Pt complains of chills, body aches, runny nose, and congestion x 1 week. Pt also states her blood pressure is high lately. Pt is aox4 and ambulatory.

## 2020-04-27 NOTE — Discharge Instructions (Signed)
Continue to take Tylenol for body aches. If hypertension persists after stopping cough suppressants, please make follow-up appointment with your primary care provider. Your blood pressure is likely elevated due to cough suppressant usage at home. You have been prescribed Tessalon Perles and Zyrtec during this urgent care encounter.

## 2020-04-28 LAB — COVID-19, FLU A+B AND RSV
Influenza A, NAA: NOT DETECTED
Influenza B, NAA: NOT DETECTED
RSV, NAA: NOT DETECTED
SARS-CoV-2, NAA: NOT DETECTED

## 2020-05-20 ENCOUNTER — Ambulatory Visit: Payer: Self-pay | Admitting: Physician Assistant

## 2020-06-03 ENCOUNTER — Other Ambulatory Visit: Payer: Self-pay | Admitting: Physician Assistant

## 2020-06-03 ENCOUNTER — Encounter: Payer: Self-pay | Admitting: Physician Assistant

## 2020-06-03 ENCOUNTER — Ambulatory Visit: Payer: Self-pay | Attending: Physician Assistant | Admitting: Physician Assistant

## 2020-06-03 ENCOUNTER — Other Ambulatory Visit: Payer: Self-pay

## 2020-06-03 VITALS — BP 172/98 | HR 74 | Temp 98.6°F | Ht 67.0 in | Wt 132.0 lb

## 2020-06-03 DIAGNOSIS — R2231 Localized swelling, mass and lump, right upper limb: Secondary | ICD-10-CM

## 2020-06-03 DIAGNOSIS — Z131 Encounter for screening for diabetes mellitus: Secondary | ICD-10-CM

## 2020-06-03 DIAGNOSIS — R5383 Other fatigue: Secondary | ICD-10-CM

## 2020-06-03 DIAGNOSIS — I160 Hypertensive urgency: Secondary | ICD-10-CM

## 2020-06-03 DIAGNOSIS — R634 Abnormal weight loss: Secondary | ICD-10-CM

## 2020-06-03 DIAGNOSIS — R053 Chronic cough: Secondary | ICD-10-CM

## 2020-06-03 DIAGNOSIS — Z13220 Encounter for screening for lipoid disorders: Secondary | ICD-10-CM

## 2020-06-03 LAB — GLUCOSE, POCT (MANUAL RESULT ENTRY): POC Glucose: 109 mg/dl — AB (ref 70–99)

## 2020-06-03 MED ORDER — LOSARTAN POTASSIUM-HCTZ 100-25 MG PO TABS: 1.0000 | ORAL_TABLET | Freq: Every day | ORAL | 3 refills | Status: DC

## 2020-06-03 MED ORDER — AZITHROMYCIN 250 MG PO TABS: ORAL_TABLET | ORAL | 0 refills | Status: DC

## 2020-06-03 MED ORDER — AMLODIPINE BESYLATE 10 MG PO TABS: 10.0000 mg | ORAL_TABLET | Freq: Every day | ORAL | 2 refills | Status: DC

## 2020-06-03 MED FILL — AZITHROMYCIN 250 MG TABLET: 250 | 5 days supply | Qty: 6 | Fill #0

## 2020-06-03 MED FILL — LOSARTAN-HCTZ 100-25 MG TAB: 100-25 | 30 days supply | Qty: 30 | Fill #0

## 2020-06-03 MED FILL — AMLODIPINE BESYLATE 10 MG T: 10 | 30 days supply | Qty: 30 | Fill #0

## 2020-06-03 NOTE — Patient Instructions (Addendum)
Start new dose of losartan HCT and continue amlodipine 10mg  daily.  Check your blood pressure daily and record and bring/have available for next visit   Managing Your Hypertension Hypertension is commonly called high blood pressure. This is when the force of your blood pressing against the walls of your arteries is too strong. Arteries are blood vessels that carry blood from your heart throughout your body. Hypertension forces the heart to work harder to pump blood, and may cause the arteries to become narrow or stiff. Having untreated or uncontrolled hypertension can cause heart attack, stroke, kidney disease, and other problems. What are blood pressure readings? A blood pressure reading consists of a higher number over a lower number. Ideally, your blood pressure should be below 120/80. The first ("top") number is called the systolic pressure. It is a measure of the pressure in your arteries as your heart beats. The second ("bottom") number is called the diastolic pressure. It is a measure of the pressure in your arteries as the heart relaxes. What does my blood pressure reading mean? Blood pressure is classified into four stages. Based on your blood pressure reading, your health care provider may use the following stages to determine what type of treatment you need, if any. Systolic pressure and diastolic pressure are measured in a unit called mm Hg. Normal  Systolic pressure: below 120.  Diastolic pressure: below 80. Elevated  Systolic pressure: 120-129.  Diastolic pressure: below 80. Hypertension stage 1  Systolic pressure: 130-139.  Diastolic pressure: 80-89. Hypertension stage 2  Systolic pressure: 140 or above.  Diastolic pressure: 90 or above. What health risks are associated with hypertension? Managing your hypertension is an important responsibility. Uncontrolled hypertension can lead to:  A heart attack.  A stroke.  A weakened blood vessel (aneurysm).  Heart  failure.  Kidney damage.  Eye damage.  Metabolic syndrome.  Memory and concentration problems. What changes can I make to manage my hypertension? Hypertension can be managed by making lifestyle changes and possibly by taking medicines. Your health care provider will help you make a plan to bring your blood pressure within a normal range. Eating and drinking   Eat a diet that is high in fiber and potassium, and low in salt (sodium), added sugar, and fat. An example eating plan is called the DASH (Dietary Approaches to Stop Hypertension) diet. To eat this way: ? Eat plenty of fresh fruits and vegetables. Try to fill half of your plate at each meal with fruits and vegetables. ? Eat whole grains, such as whole wheat pasta, brown rice, or whole grain bread. Fill about one quarter of your plate with whole grains. ? Eat low-fat diary products. ? Avoid fatty cuts of meat, processed or cured meats, and poultry with skin. Fill about one quarter of your plate with lean proteins such as fish, chicken without skin, beans, eggs, and tofu. ? Avoid premade and processed foods. These tend to be higher in sodium, added sugar, and fat.  Reduce your daily sodium intake. Most people with hypertension should eat less than 1,500 mg of sodium a day.  Limit alcohol intake to no more than 1 drink a day for nonpregnant women and 2 drinks a day for men. One drink equals 12 oz of beer, 5 oz of wine, or 1 oz of hard liquor. Lifestyle  Work with your health care provider to maintain a healthy body weight, or to lose weight. Ask what an ideal weight is for you.  Get at least 30  minutes of exercise that causes your heart to beat faster (aerobic exercise) most days of the week. Activities may include walking, swimming, or biking.  Include exercise to strengthen your muscles (resistance exercise), such as weight lifting, as part of your weekly exercise routine. Try to do these types of exercises for 30 minutes at least  3 days a week.  Do not use any products that contain nicotine or tobacco, such as cigarettes and e-cigarettes. If you need help quitting, ask your health care provider.  Control any long-term (chronic) conditions you have, such as high cholesterol or diabetes. Monitoring  Monitor your blood pressure at home as told by your health care provider. Your personal target blood pressure may vary depending on your medical conditions, your age, and other factors.  Have your blood pressure checked regularly, as often as told by your health care provider. Working with your health care provider  Review all the medicines you take with your health care provider because there may be side effects or interactions.  Talk with your health care provider about your diet, exercise habits, and other lifestyle factors that may be contributing to hypertension.  Visit your health care provider regularly. Your health care provider can help you create and adjust your plan for managing hypertension. Will I need medicine to control my blood pressure? Your health care provider may prescribe medicine if lifestyle changes are not enough to get your blood pressure under control, and if:  Your systolic blood pressure is 130 or higher.  Your diastolic blood pressure is 80 or higher. Take medicines only as told by your health care provider. Follow the directions carefully. Blood pressure medicines must be taken as prescribed. The medicine does not work as well when you skip doses. Skipping doses also puts you at risk for problems. Contact a health care provider if:  You think you are having a reaction to medicines you have taken.  You have repeated (recurrent) headaches.  You feel dizzy.  You have swelling in your ankles.  You have trouble with your vision. Get help right away if:  You develop a severe headache or confusion.  You have unusual weakness or numbness, or you feel faint.  You have severe pain in your  chest or abdomen.  You vomit repeatedly.  You have trouble breathing. Summary  Hypertension is when the force of blood pumping through your arteries is too strong. If this condition is not controlled, it may put you at risk for serious complications.  Your personal target blood pressure may vary depending on your medical conditions, your age, and other factors. For most people, a normal blood pressure is less than 120/80.  Hypertension is managed by lifestyle changes, medicines, or both. Lifestyle changes include weight loss, eating a healthy, low-sodium diet, exercising more, and limiting alcohol. This information is not intended to replace advice given to you by your health care provider. Make sure you discuss any questions you have with your health care provider. Document Revised: 09/13/2018 Document Reviewed: 04/19/2016 Elsevier Patient Education  2020 [employer].

## 2020-06-03 NOTE — Progress Notes (Signed)
Patient ID: [name], female   DOB: 05-17-1971, 49 y.o.   MRN: [id number]   [name], is a 49 y.o. female  FYB:[id number]  NID:[id number]  DOB - 08-Nov-1970  Subjective:  Chief Complaint and HPI: [name] is a 49 y.o. female here today with long-standing uncontrolled hypertension. She does check her BP at home and "it's always high."  No HA/CP/dizziness/vision change.  H/o non-compliance but says she is taking all meds.  She needs to reschedule her appt with cardiology bc she couldn't go due to cough  Also c/o R thumb growth that continues to get larger and is starting to be painful. She is R hand dominant.   She has had a cough for about 1 month.  She is coughing up some greenish brown phlegm.  No fever.  She has had several negative Covid tests.  No change is taste/smell.    Also concerned with weight loss and fatigue.  She has lost 7 pounds since 07/2019.   ROS:   Constitutional:  No f/c, No night sweats,  EENT:  No vision changes, No blurry vision, No hearing changes. No mouth, throat, or ear problems.  Respiratory: + cough, No SOB Cardiac: No CP, no palpitations GI:  No abd pain, No N/V/D. GU: No Urinary s/sx Musculoskeletal: R joint pain Neuro: No headache, no dizziness, no motor weakness.  Skin: No rash Endocrine:  No polydipsia. No polyuria.  Psych: Denies SI/HI  No problems updated.  ALLERGIES: No Known Allergies  PAST MEDICAL HISTORY: Past Medical History:  Diagnosis Date   Hypertension     MEDICATIONS AT HOME: Prior to Admission medications   Medication Sig Start Date End Date Taking? Authorizing Provider  azithromycin (ZITHROMAX) 250 MG tablet Take 2 today then 1 daily 06/03/20  Yes Jacilyn Sanpedro M, PA-C  losartan-hydrochlorothiazide (HYZAAR) 100-25 MG tablet Take 1 tablet by mouth daily. 06/03/20  Yes Georgian Co M, PA-C  amLODipine (NORVASC) 10 MG tablet Take 1 tablet (10 mg total) by mouth daily. 06/03/20   Anders Simmonds, PA-C  cetirizine (ZYRTEC ALLERGY) 10 MG tablet Take 1 tablet (10 mg total) by mouth daily for 10 days. 04/27/20 05/07/20  Orvil Feil, PA-C  cyclobenzaprine (FLEXERIL) 10 MG tablet Take 0.5-1 tablets (5-10 mg total) by mouth at bedtime as needed for muscle spasms. Patient not taking: Reported on 07/18/2019 08/11/17   Belinda Fisher, PA-C  hydrochlorothiazide (HYDRODIURIL) 12.5 MG tablet Take 1 tablet (12.5 mg total) by mouth daily. Patient not taking: Reported on 06/03/2020 07/27/17   Lizbeth Bark, FNP  meloxicam (MOBIC) 7.5 MG tablet Take 1 tablet (7.5 mg total) by mouth daily. Patient not taking: Reported on 07/18/2019 08/11/17   Belinda Fisher, PA-C     Objective:  EXAM:   Vitals:   06/03/20 1606  BP: (!) 172/98  Pulse: 74  Temp: 98.6 F (37 C)  TempSrc: Oral  SpO2: 99%  Weight: 132 lb (59.9 kg)  Height: 5\' 7"  (1.702 m)    General appearance : A&OX3. NAD. Non-toxic-appearing HEENT: Atraumatic and Normocephalic.  PERRLA. EOM intact.  Neck: supple, no JVD. No cervical lymphadenopathy. No thyromegaly Chest/Lungs:  Breathing-non-labored, Good air entry bilaterally, breath sounds normal without rales, rhonchi, or wheezing  CVS: S1 S2 regular, no murmurs, gallops, rubs  R thumb with PIP much larger than L.  ROM and strength WNL.   Extremities: Bilateral Lower Ext shows no edema, both legs are warm to touch with = pulse throughout Neurology:  CN II-XII grossly intact, Non focal.   Psych:  TP linear. J/I WNL. Normal speech. Appropriate eye contact and affect.  Skin:  No Rash  Data Review No results found for: HGBA1C   Assessment & Plan   1. Hypertensive urgency, malignant BP is actually usu higher than today but uncontrolled.  Increase dose of losartan/hct 100/25-1 daily and check BP daily and record and have available for next visit. Reschedule cardiology appt.  Patient verbalizes understanding.   - Comprehensive metabolic panel - CBC with Differential/Platelet continue-  amLODipine (NORVASC) 10 MG tablet; Take 1 tablet (10 mg total) by mouth daily.  Dispense: 30 tablet; Refill: 2  2. Fatigue, unspecified type - Vitamin D, 25-hydroxy - Thyroid Panel With TSH  3. Persistent cough Will cover for atypicals - azithromycin (ZITHROMAX) 250 MG tablet; Take 2 today then 1 daily  Dispense: 6 tablet; Refill: 0  4. Subcutaneous mass of right thumb Refer ortho.  ?mass  5. Screening cholesterol level - Lipid panel  6. Screening for diabetes mellitus I have had a lengthy discussion and provided education about insulin resistance and the intake of too much sugar/refined carbohydrates.  I have advised the patient to work at a goal of eliminating sugary drinks, candy, desserts, sweets, refined sugars, processed foods, and white carbohydrates.  The patient expresses understanding.  - Hemoglobin A1c - Glucose (CBG)  7. Weight loss - Thyroid Panel With TSH - Glucose (CBG)   Patient have been counseled extensively about nutrition and exercise  Return for 3 weeks with [name] for BP management and 2 months be established with new PCP.  The patient was given clear instructions to go to ER or return to medical center if symptoms don't improve, worsen or new problems develop. The patient verbalized understanding. The patient was told to call to get lab results if they haven't heard anything in the next week.     Georgian Co, PA-C [facility] [place], [place] [phone]   06/03/2020, 4:27 PM

## 2020-06-04 LAB — LIPID PANEL
Chol/HDL Ratio: 2.6 ratio (ref 0.0–4.4)
Cholesterol, Total: 175 mg/dL (ref 100–199)
HDL: 68 mg/dL (ref >39)
LDL Chol Calc (NIH): 95 mg/dL (ref 0–99)
Triglycerides: 65 mg/dL (ref 0–149)
VLDL Cholesterol Cal: 12 mg/dL (ref 5–40)

## 2020-06-04 LAB — COMPREHENSIVE METABOLIC PANEL WITH GFR
ALT: 14 IU/L (ref 0–32)
AST: 21 IU/L (ref 0–40)
Albumin/Globulin Ratio: 1.3 (ref 1.2–2.2)
Albumin: 4.3 g/dL (ref 3.8–4.9)
Alkaline Phosphatase: 74 IU/L (ref 44–121)
BUN/Creatinine Ratio: 16 (ref 9–23)
BUN: 15 mg/dL (ref 6–24)
Bilirubin Total: 0.4 mg/dL (ref 0.0–1.2)
CO2: 26 mmol/L (ref 20–29)
Calcium: 10.1 mg/dL (ref 8.7–10.2)
Chloride: 102 mmol/L (ref 96–106)
Creatinine, Ser: 0.95 mg/dL (ref 0.57–1.00)
GFR calc Af Amer: 80 mL/min/1.73
GFR calc non Af Amer: 70 mL/min/1.73
Globulin, Total: 3.2 g/dL (ref 1.5–4.5)
Glucose: 86 mg/dL (ref 65–99)
Potassium: 4.2 mmol/L (ref 3.5–5.2)
Sodium: 140 mmol/L (ref 134–144)
Total Protein: 7.5 g/dL (ref 6.0–8.5)

## 2020-06-04 LAB — CBC WITH DIFFERENTIAL/PLATELET
Basophils Absolute: 0 x10E3/uL (ref 0.0–0.2)
Basos: 1 %
EOS (ABSOLUTE): 0.2 x10E3/uL (ref 0.0–0.4)
Eos: 3 %
Hematocrit: 36.3 % (ref 34.0–46.6)
Hemoglobin: 12.7 g/dL (ref 11.1–15.9)
Immature Grans (Abs): 0 x10E3/uL (ref 0.0–0.1)
Immature Granulocytes: 0 %
Lymphocytes Absolute: 4.3 x10E3/uL — ABNORMAL HIGH (ref 0.7–3.1)
Lymphs: 61 %
MCH: 32.9 pg (ref 26.6–33.0)
MCHC: 35 g/dL (ref 31.5–35.7)
MCV: 94 fL (ref 79–97)
Monocytes Absolute: 0.5 x10E3/uL (ref 0.1–0.9)
Monocytes: 8 %
Neutrophils Absolute: 1.9 x10E3/uL (ref 1.4–7.0)
Neutrophils: 27 %
Platelets: 332 x10E3/uL (ref 150–450)
RBC: 3.86 x10E6/uL (ref 3.77–5.28)
RDW: 12.6 % (ref 11.7–15.4)
WBC: 6.9 x10E3/uL (ref 3.4–10.8)

## 2020-06-04 LAB — THYROID PANEL WITH TSH
Free Thyroxine Index: 3.2 (ref 1.2–4.9)
T3 Uptake Ratio: 38 % (ref 24–39)
T4, Total: 8.4 ug/dL (ref 4.5–12.0)
TSH: 1.01 u[IU]/mL (ref 0.450–4.500)

## 2020-06-04 LAB — HEMOGLOBIN A1C
Est. average glucose Bld gHb Est-mCnc: 126 mg/dL
Hgb A1c MFr Bld: 6 % — ABNORMAL HIGH (ref 4.8–5.6)

## 2020-06-04 LAB — VITAMIN D 25 HYDROXY (VIT D DEFICIENCY, FRACTURES): Vit D, 25-Hydroxy: 46.2 ng/mL (ref 30.0–100.0)

## 2020-06-09 ENCOUNTER — Telehealth (INDEPENDENT_AMBULATORY_CARE_PROVIDER_SITE_OTHER): Payer: Self-pay

## 2020-06-09 NOTE — Telephone Encounter (Signed)
Spoke with patient. After verifying date of birth she was informed that she is in the prediabetes range. Please eliminate sugar/refined carbohydrates. Also eliminating sugary drinks, candy, desserts, sweets, refined sugars, processed foods, and white carbohydrates to reduce risk of developing diabetes. Cholesterol is good. Kidney function, blood count, vitamin D, liver function, and electrolytes are good. Follow up as planned. Keep scheduled appointments for 06/24/20 and 08/02/20. She verbalized understanding.Maryjean Morn, CMA

## 2020-06-09 NOTE — Telephone Encounter (Signed)
-----   Message from Anders Simmonds, [place] sent at 06/08/2020 11:59 AM EST ----- Please call patient.  She is in the prediabetes range.  Please eliminate sugar/refined carbohydrates.  Also eliminating sugary drinks, candy, desserts, sweets, refined sugars, processed foods, and white carbohydrates to reduce risk of developing diabetes.  Cholesterol is good.  Kidney function, blood count, vitamin D, liver function, and electrolytes are good.  Follow up as planned.  Thanks, Georgian Co, PA-C

## 2020-06-23 ENCOUNTER — Ambulatory Visit: Payer: Self-pay | Admitting: Physician Assistant

## 2020-06-23 ENCOUNTER — Ambulatory Visit: Payer: Self-pay | Admitting: Orthopaedic Surgery

## 2020-06-24 ENCOUNTER — Ambulatory Visit: Payer: Self-pay | Admitting: Pharmacist

## 2020-07-08 ENCOUNTER — Ambulatory Visit: Payer: Self-pay | Admitting: Physician Assistant

## 2020-07-19 MED FILL — AMLODIPINE BESYLATE 10 MG T: 10 | 30 days supply | Qty: 30 | Fill #1

## 2020-07-19 MED FILL — LOSARTAN-HCTZ 100-25 MG TAB: 100-25 | 30 days supply | Qty: 30 | Fill #1

## 2020-08-02 ENCOUNTER — Ambulatory Visit: Payer: Self-pay | Admitting: Nurse Practitioner

## 2020-08-27 ENCOUNTER — Ambulatory Visit: Payer: Self-pay | Attending: Nurse Practitioner | Admitting: Nurse Practitioner

## 2020-08-27 ENCOUNTER — Other Ambulatory Visit: Payer: Self-pay

## 2020-08-30 MED FILL — AMLODIPINE BESYLATE 10 MG T: 10 | 30 days supply | Qty: 30 | Fill #2

## 2020-08-30 MED FILL — LOSARTAN-HCTZ 100-25 MG TAB: 100-25 | 30 days supply | Qty: 30 | Fill #2

## 2020-09-30 ENCOUNTER — Other Ambulatory Visit: Payer: Self-pay | Admitting: Physician Assistant

## 2020-09-30 NOTE — Telephone Encounter (Signed)
   Notes to clinic: Patient was due for follow up to establish with pcp Patient has several no shows and canceled last appt    Requested Prescriptions  Pending Prescriptions Disp Refills   amLODipine (NORVASC) 10 MG tablet 30 tablet 2    Sig: TAKE 1 TABLET (10 MG TOTAL) BY MOUTH DAILY.      Cardiovascular:  Calcium Channel Blockers Failed - 09/30/2020  2:39 PM      Failed - Last BP in normal range    BP Readings from Last 1 Encounters:  06/03/20 (!) 172/98          Passed - Valid encounter within last 6 months    Recent Outpatient Visits           3 months ago Hypertensive urgency, malignant   [facility] [place], [place], [place]   1 year ago Hypertensive urgency, malignant   [facility] F[place], [place], [place]   3 years ago Malignant hypertension   [facility] [place], [place] R, FNP   5 years ago Accelerated hypertension   [facility] Dessa Phi, MD   7 years ago Accelerated hypertension   [facility] [place], Phylliss Blakes, MD

## 2020-10-01 ENCOUNTER — Other Ambulatory Visit: Payer: Self-pay

## 2020-10-01 MED ORDER — AMLODIPINE BESYLATE 10 MG PO TABS
10.0000 mg | ORAL_TABLET | Freq: Every day | ORAL | 0 refills | Status: DC
  Filled 2020-10-01: qty 30, 30d supply, fill #0

## 2020-10-01 MED FILL — Losartan Potassium & Hydrochlorothiazide Tab 100-25 MG: ORAL | 30 days supply | Qty: 30 | Fill #0 | Status: AC

## 2020-11-05 ENCOUNTER — Other Ambulatory Visit: Payer: Self-pay | Admitting: Family Medicine

## 2020-11-05 ENCOUNTER — Other Ambulatory Visit: Payer: Self-pay

## 2020-11-05 MED FILL — Losartan Potassium & Hydrochlorothiazide Tab 100-25 MG: ORAL | 30 days supply | Qty: 30 | Fill #1 | Status: AC

## 2020-11-05 NOTE — Telephone Encounter (Signed)
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      Cardiovascular:  Calcium Channel Blockers Failed - 11/05/2020  2:15 PM      Failed - Last BP in normal range    BP Readings from Last 1 Encounters:  06/03/20 (!) 172/98          Passed - Valid encounter within last 6 months    Recent Outpatient Visits           5 months ago Hypertensive urgency, malignant   [facility] [place], [place], [place]   1 year ago Hypertensive urgency, malignant   [facility] F[place], [place], [place]   3 years ago Malignant hypertension   [facility] [place], [place] R, FNP   5 years ago Accelerated hypertension   [facility] Dessa Phi, [place]   7 years ago Accelerated hypertension   [facility] [place], Phylliss Blakes, [place]

## 2020-11-08 ENCOUNTER — Other Ambulatory Visit: Payer: Self-pay

## 2020-12-17 ENCOUNTER — Ambulatory Visit: Payer: Self-pay | Admitting: Nurse Practitioner

## 2020-12-17 MED FILL — Losartan Potassium & Hydrochlorothiazide Tab 100-25 MG: ORAL | 30 days supply | Qty: 30 | Fill #2 | Status: AC

## 2020-12-20 ENCOUNTER — Other Ambulatory Visit: Payer: Self-pay

## 2021-01-27 MED FILL — Losartan Potassium & Hydrochlorothiazide Tab 100-25 MG: ORAL | 30 days supply | Qty: 30 | Fill #3 | Status: AC

## 2021-01-28 ENCOUNTER — Other Ambulatory Visit: Payer: Self-pay

## 2021-02-28 ENCOUNTER — Other Ambulatory Visit: Payer: Self-pay

## 2021-02-28 MED FILL — Losartan Potassium & Hydrochlorothiazide Tab 100-25 MG: ORAL | 30 days supply | Qty: 30 | Fill #4 | Status: AC

## 2021-03-02 ENCOUNTER — Other Ambulatory Visit: Payer: Self-pay

## 2021-03-11 ENCOUNTER — Other Ambulatory Visit: Payer: Self-pay

## 2021-03-11 ENCOUNTER — Ambulatory Visit
Admission: EM | Admit: 2021-03-11 | Discharge: 2021-03-11 | Disposition: A | Discharge status: Home / Self Care | Payer: Self-pay | Attending: Internal Medicine | Admitting: Internal Medicine

## 2021-03-11 DIAGNOSIS — Z20822 Contact with and (suspected) exposure to covid-19: Secondary | ICD-10-CM | POA: Insufficient documentation

## 2021-03-11 DIAGNOSIS — R509 Fever, unspecified: Secondary | ICD-10-CM | POA: Insufficient documentation

## 2021-03-11 DIAGNOSIS — J069 Acute upper respiratory infection, unspecified: Secondary | ICD-10-CM | POA: Insufficient documentation

## 2021-03-11 DIAGNOSIS — J029 Acute pharyngitis, unspecified: Secondary | ICD-10-CM | POA: Insufficient documentation

## 2021-03-11 DIAGNOSIS — R6889 Other general symptoms and signs: Secondary | ICD-10-CM | POA: Insufficient documentation

## 2021-03-11 LAB — POCT INFLUENZA A/B
Influenza A, POC: NEGATIVE
Influenza B, POC: NEGATIVE

## 2021-03-11 LAB — POCT RAPID STREP A (OFFICE): Rapid Strep A Screen: NEGATIVE

## 2021-03-11 NOTE — Discharge Instructions (Signed)
You likely having a viral upper respiratory infection. We recommended symptom control. I expect your symptoms to start improving in the next 1-2 weeks.   1. Take a daily allergy pill/anti-histamine like Zyrtec, Claritin, or Store brand consistently for 2 weeks  2. For congestion you may try an oral decongestant like Mucinex or sudafed. You may also try intranasal flonase nasal spray or saline irrigations (neti pot, sinus cleanse)  3. For your sore throat you may try cepacol lozenges, salt water gargles, throat spray. Treatment of congestion may also help your sore throat.  4. For cough you may try Robitussen, Mucinex DM  5. Take Tylenol or Ibuprofen to help with pain/inflammation  6. Stay hydrated, drink plenty of fluids to keep throat coated and less irritated  Honey Tea For cough/sore throat try using a honey-based tea. Use 3 teaspoons of honey with juice squeezed from half lemon. Place shaved pieces of ginger into 1/2-1 cup of water and warm over stove top. Then mix the ingredients and repeat every 4 hours as needed.   Please increase water intake. Rapid strep and flu tests were negative. Throat culture and covid 19 swabs are pending. We will call if they are positive.

## 2021-03-11 NOTE — ED Triage Notes (Signed)
Pt c/o nasal congestion with drainage, diarrhea, sore throat, general malaise, headache, fever at home (101F), vomiting. Denies cough. States has taken zyrtec and cough drops at home without relief. Onset last Tuesday.
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
## 2021-03-12 LAB — NOVEL CORONAVIRUS, NAA: SARS-CoV-2, NAA: NOT DETECTED

## 2021-03-12 LAB — SARS-COV-2, NAA 2 DAY TAT

## 2021-03-14 LAB — CULTURE, GROUP A STREP (THRC)

## 2021-04-07 ENCOUNTER — Other Ambulatory Visit: Payer: Self-pay

## 2021-04-07 MED FILL — Losartan Potassium & Hydrochlorothiazide Tab 100-25 MG: ORAL | 30 days supply | Qty: 30 | Fill #5 | Status: AC

## 2021-04-08 ENCOUNTER — Other Ambulatory Visit: Payer: Self-pay

## 2021-04-20 ENCOUNTER — Ambulatory Visit: Payer: Self-pay | Admitting: Nurse Practitioner

## 2021-05-13 ENCOUNTER — Other Ambulatory Visit: Payer: Self-pay

## 2021-05-13 MED FILL — Losartan Potassium & Hydrochlorothiazide Tab 100-25 MG: ORAL | 30 days supply | Qty: 30 | Fill #6 | Status: AC

## 2021-05-19 ENCOUNTER — Other Ambulatory Visit: Payer: Self-pay

## 2021-06-24 ENCOUNTER — Other Ambulatory Visit: Payer: Self-pay | Admitting: Physician Assistant

## 2021-06-24 MED ORDER — LOSARTAN POTASSIUM-HCTZ 100-25 MG PO TABS
1.0000 | ORAL_TABLET | Freq: Every day | ORAL | 0 refills | Status: DC
  Filled 2021-06-24: qty 90, 90d supply, fill #0
  Filled 2021-06-24: qty 30, 30d supply, fill #0
  Filled 2021-08-08: qty 30, 30d supply, fill #1
  Filled 2021-09-16: qty 30, 30d supply, fill #2

## 2021-06-24 NOTE — Telephone Encounter (Signed)
Requested Prescriptions  Pending Prescriptions Disp Refills   losartan-hydrochlorothiazide (HYZAAR) 100-25 MG tablet 90 tablet 3    Sig: TAKE 1 TABLET BY MOUTH DAILY.     Cardiovascular: ARB + Diuretic Combos Failed - 06/24/2021 12:59 PM      Failed - K in normal range and within 180 days    Potassium  Date Value Ref Range Status  06/03/2020 4.2 3.5 - 5.2 mmol/L Final         Failed - Na in normal range and within 180 days    Sodium  Date Value Ref Range Status  06/03/2020 140 134 - 144 mmol/L Final         Failed - Cr in normal range and within 180 days    Creat  Date Value Ref Range Status  04/22/2013 0.92 0.50 - 1.10 mg/dL Final   Creatinine, Ser  Date Value Ref Range Status  06/03/2020 0.95 0.57 - 1.00 mg/dL Final   Creatinine, POC  Date Value Ref Range Status  05/11/2017 200 mg/dL Final         Failed - Ca in normal range and within 180 days    Calcium  Date Value Ref Range Status  06/03/2020 10.1 8.7 - 10.2 mg/dL Final   Calcium, Ion  Date Value Ref Range Status  12/01/2014 1.24 (H) 1.12 - 1.23 mmol/L Final         Failed - Last BP in normal range    BP Readings from Last 1 Encounters:  03/11/21 129/90         Failed - Valid encounter within last 6 months    Recent Outpatient Visits          1 year ago Hypertensive urgency, malignant   [facility] [facility] [place], [place], [place]   1 year ago Hypertensive urgency, malignant   [facility] Fulp, Brush, MD   4 years ago Malignant hypertension   [facility] [place], [place] R, FNP   6 years ago Accelerated hypertension   [facility] Dessa Phi, MD   8 years ago Accelerated hypertension   [facility] Quentin Angst, MD      Future Appointments            In 5 days Claiborne Rigg, NP [facility]           Passed  - Patient is not pregnant

## 2021-06-27 ENCOUNTER — Other Ambulatory Visit: Payer: Self-pay

## 2021-06-29 ENCOUNTER — Other Ambulatory Visit: Payer: Self-pay

## 2021-06-29 ENCOUNTER — Ambulatory Visit: Payer: Self-pay | Attending: Nurse Practitioner | Admitting: Nurse Practitioner

## 2021-06-29 ENCOUNTER — Encounter: Payer: Self-pay | Admitting: Nurse Practitioner

## 2021-06-29 DIAGNOSIS — Z114 Encounter for screening for human immunodeficiency virus [HIV]: Secondary | ICD-10-CM

## 2021-06-29 DIAGNOSIS — Z1231 Encounter for screening mammogram for malignant neoplasm of breast: Secondary | ICD-10-CM

## 2021-06-29 DIAGNOSIS — Z1159 Encounter for screening for other viral diseases: Secondary | ICD-10-CM

## 2021-06-29 DIAGNOSIS — R7303 Prediabetes: Secondary | ICD-10-CM

## 2021-06-29 DIAGNOSIS — I10 Essential (primary) hypertension: Secondary | ICD-10-CM

## 2021-06-29 DIAGNOSIS — Z8673 Personal history of transient ischemic attack (TIA), and cerebral infarction without residual deficits: Secondary | ICD-10-CM

## 2021-06-29 DIAGNOSIS — Z1211 Encounter for screening for malignant neoplasm of colon: Secondary | ICD-10-CM

## 2021-06-29 MED ORDER — AMLODIPINE BESYLATE 10 MG PO TABS: 10.0000 mg | ORAL_TABLET | Freq: Every day | ORAL | 0 refills | Status: DC

## 2021-06-29 NOTE — Progress Notes (Signed)
Virtual Visit via Telephone Note Due to national recommendations of social distancing due to [place] 19, telehealth visit is felt to be most appropriate for this patient at this time.  I discussed the limitations, risks, security and privacy concerns of performing an evaluation and management service by telephone and the availability of in person appointments. I also discussed with the patient that there may be a patient responsible charge related to this service. The patient expressed understanding and agreed to proceed.    I connected with [name] on [birth year]  at   1:30 PM EST  EDT by telephone and verified that I am speaking with the correct person using two identifiers.  Location of Patient: Private Residence   Location of Provider: [place] and [place] participating in Telemedicine visit: Geryl Rankins FNP-BC [name]    History of Present Illness: Telemedicine visit for: HTN and medication refills  She does not monitor her blood pressure at home. She is requesting refills of amlodipine and Hyzaar 100-25 mg daily. She has been out of her medications since last month. Blood pressure is not optimal.  BP Readings from Last 3 Encounters:  03/11/21 129/90  06/03/20 (!) 172/98  04/27/20 (!) 169/107       Past Medical History:  Diagnosis Date   Hypertension     Past Surgical History:  Procedure Laterality Date   CESAREAN SECTION     TUBAL LIGATION      Family History  Problem Relation Age of Onset   Hypertension Mother    Diabetes Mellitus II Mother    Hypertension Father    Diabetes Mellitus II Father    Hypertension Sister    Lung cancer Other     Social History   Socioeconomic History   Marital status: Single    Spouse name: Not on file   Number of children: Not on file   Years of education: Not on file   Highest education level: Not on file  Occupational History   Not on file  Tobacco Use   Smoking  status: Never   Smokeless tobacco: Never  Vaping Use   Vaping Use: Never used  Substance and Sexual Activity   Alcohol use: Yes    Comment: social   Drug use: Yes    Frequency: 2.0 times per week    Types: Marijuana   Sexual activity: Yes    Birth control/protection: Surgical  Other Topics Concern   Not on file  Social History Narrative   Not on file   Social Determinants of Health   Financial Resource Strain: Not on file  Food Insecurity: Not on file  Transportation Needs: Not on file  Physical Activity: Not on file  Stress: Not on file  Social Connections: Not on file     Observations/Objective: Awake, alert and oriented x 3   Review of Systems  Constitutional:  Negative for fever, malaise/fatigue and weight loss.  HENT: Negative.  Negative for nosebleeds.   Eyes: Negative.  Negative for blurred vision, double vision and photophobia.  Respiratory: Negative.  Negative for cough and shortness of breath.   Cardiovascular: Negative.  Negative for chest pain, palpitations and leg swelling.  Gastrointestinal: Negative.  Negative for heartburn, nausea and vomiting.  Musculoskeletal: Negative.  Negative for myalgias.  Neurological: Negative.  Negative for dizziness, focal weakness, seizures and headaches.  Psychiatric/Behavioral: Negative.  Negative for suicidal ideas.    Assessment and Plan: Diagnoses and all orders for this  visit:  Accelerated hypertension -     CMP14+EGFR; Future -     amLODipine (NORVASC) 10 MG tablet; Take 1 tablet (10 mg total) by mouth daily. Will schedule for office visit to recheck BP   Breast cancer screening by mammogram -     MS DIGITAL SCREENING TOMO BILATERAL; Future  Colon cancer screening -     Fecal occult blood, imunochemical([facility]); Future  Encounter for screening for HIV -     HIV antibody (with reflex); Future  Need for hepatitis C screening test -     HCV Ab w Reflex to Quant PCR; Future  Prediabetes -      Hemoglobin A1c; Future  Remote history of stroke -     Lipid panel; Future -     CBC with Differential; Future     Follow Up Instructions Return for PAP SMEAR.     I discussed the assessment and treatment plan with the patient. The patient was provided an opportunity to ask questions and all were answered. The patient agreed with the plan and demonstrated an understanding of the instructions.   The patient was advised to call back or seek an in-person evaluation if the symptoms worsen or if the condition fails to improve as anticipated.  I provided 13 minutes of non-face-to-face time during this encounter including median intraservice time, reviewing previous notes, labs, imaging, medications and explaining diagnosis and management.  Gildardo Pounds, FNP-BC

## 2021-07-08 LAB — FECAL OCCULT BLOOD, IMMUNOCHEMICAL: Fecal Occult Bld: NEGATIVE

## 2021-07-11 ENCOUNTER — Telehealth: Payer: Self-pay

## 2021-07-11 NOTE — Telephone Encounter (Signed)
Pt given lab results per notes of Bertram Denver, NP on 07/08/21. Pt verbalized understanding.   Claiborne Rigg, NP  07/08/2021  8:52 PM EST Back to Top    Negative stool test. Repeat next year

## 2021-07-11 NOTE — Telephone Encounter (Signed)
Left message to return call to our office.  

## 2021-07-11 NOTE — Telephone Encounter (Signed)
-----   Message from Claiborne Rigg, NP sent at 07/08/2021  8:52 PM EST ----- Negative stool test. Repeat next year

## 2021-07-27 ENCOUNTER — Ambulatory Visit
Admission: RE | Admit: 2021-07-27 | Discharge: 2021-07-27 | Disposition: A | Discharge status: Home / Self Care | Payer: Managed Care, Other (non HMO) | Source: Ambulatory Visit | Attending: Nurse Practitioner | Admitting: Nurse Practitioner

## 2021-07-27 DIAGNOSIS — Z1231 Encounter for screening mammogram for malignant neoplasm of breast: Secondary | ICD-10-CM

## 2021-08-09 ENCOUNTER — Other Ambulatory Visit: Payer: Self-pay

## 2021-08-10 ENCOUNTER — Other Ambulatory Visit: Payer: Self-pay

## 2021-08-13 ENCOUNTER — Other Ambulatory Visit: Payer: Self-pay

## 2021-08-13 ENCOUNTER — Encounter (HOSPITAL_COMMUNITY): Payer: Self-pay | Admitting: Emergency Medicine

## 2021-08-13 ENCOUNTER — Ambulatory Visit (HOSPITAL_COMMUNITY)
Admission: EM | Admit: 2021-08-13 | Discharge: 2021-08-13 | Disposition: A | Discharge status: Home / Self Care | Payer: Managed Care, Other (non HMO) | Attending: Physician Assistant | Admitting: Physician Assistant

## 2021-08-13 DIAGNOSIS — L03012 Cellulitis of left finger: Secondary | ICD-10-CM

## 2021-08-13 NOTE — Discharge Instructions (Signed)
Recommend warm soaks a few times per day over the next 2-3 days ?Return if swelling and pain returns. ?
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
## 2021-08-13 NOTE — ED Triage Notes (Signed)
Woke last week with pain in left middle finger.  Pus around cuticle of nail.   ?

## 2021-08-29 ENCOUNTER — Encounter: Payer: Self-pay | Admitting: Nurse Practitioner

## 2021-08-29 ENCOUNTER — Ambulatory Visit (HOSPITAL_BASED_OUTPATIENT_CLINIC_OR_DEPARTMENT_OTHER): Payer: Managed Care, Other (non HMO) | Admitting: Nurse Practitioner

## 2021-08-29 DIAGNOSIS — I10 Essential (primary) hypertension: Secondary | ICD-10-CM

## 2021-08-29 NOTE — Progress Notes (Signed)
Virtual Visit via Telephone Note ?Due to national recommendations of social distancing due to COVID 19, telehealth visit is felt to be most appropriate for this patient at this time.  I discussed the limitations, risks, security and privacy concerns of performing an evaluation and management service by telephone and the availability of in person appointments. I also discussed with the patient that there may be a patient responsible charge related to this service. The patient expressed understanding and agreed to proceed.  ? ? ?I connected with [name] on 08/29/21  at   2:10 PM EDT  EDT by telephone and verified that I am speaking with the correct person using two identifiers. ? ?Location of Patient: ?Private Residence ?  ?Location of Provider: ?[employer]  ?  ?Persons participating in Telemedicine visit: ?[name] ?[name]  ?  ?History of Present Illness: ?Telemedicine visit for: HTN ? ?Blood pressure is poorly controlled. She is prescribed amlodipine 10 mg daily and hyzaar 100-25 mg daily.  ?She is not monitoring her blood pressure at home. May need to add carvedilol at next visit based on BP reading.  ?BP Readings from Last 3 Encounters:  ?08/13/21 (!) 158/101  ?03/11/21 129/90  ?06/03/20 (!) 172/98  ?  ? ? ? ? ? ?Past Medical History:  ?Diagnosis Date  ? Hypertension   ?  ?Past Surgical History:  ?Procedure Laterality Date  ? CESAREAN SECTION    ? TUBAL LIGATION    ?  ?Family History  ?Problem Relation Age of Onset  ? Hypertension Mother   ? Diabetes Mellitus II Mother   ? Hypertension Father   ? Diabetes Mellitus II Father   ? Hypertension Sister   ? Lung cancer Other   ?  ?Social History  ? ?Socioeconomic History  ? Marital status: Single  ?  Spouse name: Not on file  ? Number of children: Not on file  ? Years of education: Not on file  ? Highest education level: Not on file  ?Occupational History  ? Not on file  ?Tobacco Use  ? Smoking status:  Never  ? Smokeless tobacco: Never  ?Vaping Use  ? Vaping Use: Never used  ?Substance and Sexual Activity  ? Alcohol use: Yes  ?  Comment: social  ? Drug use: Yes  ?  Frequency: 2.0 times per week  ?  Types: Marijuana  ? Sexual activity: Yes  ?  Birth control/protection: Surgical  ?Other Topics Concern  ? Not on file  ?Social History Narrative  ? Not on file  ? ?Social Determinants of Health  ? ?Financial Resource Strain: Not on file  ?Food Insecurity: Not on file  ?Transportation Needs: Not on file  ?Physical Activity: Not on file  ?Stress: Not on file  ?Social Connections: Not on file  ?  ? ?Observations/Objective: ?Awake, alert and oriented x 3 ? ? ?Review of Systems  ?Constitutional:  Negative for fever, malaise/fatigue and weight loss.  ?HENT: Negative.  Negative for nosebleeds.   ?Eyes: Negative.  Negative for blurred vision, double vision and photophobia.  ?Respiratory: Negative.  Negative for cough and shortness of breath.   ?Cardiovascular: Negative.  Negative for chest pain, palpitations and leg swelling.  ?Gastrointestinal: Negative.  Negative for heartburn, nausea and vomiting.  ?Musculoskeletal: Negative.  Negative for myalgias.  ?Neurological: Negative.  Negative for dizziness, focal weakness, seizures and headaches.  ?Psychiatric/Behavioral: Negative.  Negative for suicidal ideas.    ?Assessment and Plan: ?Diagnoses and all  orders for this visit: ? ?Primary hypertension ?Continue all antihypertensives as prescribed.  ?Recommended to Remember to bring in blood pressure log  follow up appointment.  ?DASH/Mediterranean Diets are healthier choices for HTN.   ?  ? ?Follow Up Instructions ?Return for PAP SMEAR.  ? ?  ?I discussed the assessment and treatment plan with the patient. The patient was provided an opportunity to ask questions and all were answered. The patient agreed with the plan and demonstrated an understanding of the instructions. ?  ?The patient was advised to call back or seek an in-person  evaluation if the symptoms worsen or if the condition fails to improve as anticipated. ? ?I provided 11 minutes of non-face-to-face time during this encounter including median intraservice time, reviewing previous notes, labs, imaging, medications and explaining diagnosis and management. ? ?Claiborne Rigg, Pope  ?

## 2021-09-02 ENCOUNTER — Encounter: Payer: Self-pay | Admitting: Nurse Practitioner

## 2021-09-16 ENCOUNTER — Other Ambulatory Visit: Payer: Self-pay

## 2021-09-20 ENCOUNTER — Other Ambulatory Visit: Payer: Self-pay

## 2021-09-21 ENCOUNTER — Ambulatory Visit: Payer: Managed Care, Other (non HMO) | Admitting: Physician Assistant

## 2021-10-12 ENCOUNTER — Ambulatory Visit: Payer: Managed Care, Other (non HMO) | Admitting: Physician Assistant

## 2021-11-07 ENCOUNTER — Other Ambulatory Visit: Payer: Self-pay

## 2021-11-07 ENCOUNTER — Other Ambulatory Visit: Payer: Self-pay | Admitting: Nurse Practitioner

## 2021-11-09 ENCOUNTER — Other Ambulatory Visit: Payer: Self-pay

## 2021-11-11 ENCOUNTER — Other Ambulatory Visit: Payer: Self-pay

## 2021-11-11 ENCOUNTER — Ambulatory Visit: Payer: Self-pay | Admitting: *Deleted

## 2021-11-11 NOTE — Telephone Encounter (Signed)
  Chief Complaint: Hypertension Symptoms: BP169/99 , yesterday 170/103 Has been out of BP meds for 1 week.Finger tips both hands tingling Frequency: 1 week Pertinent Negatives: Patient denies headache, visual changes, weakness Disposition: [] ED /[] Urgent Care (no appt availability in office) / [] Appointment(In office/virtual)/ []  [facility]/ [] Home Ca[] re/ [x] Refused Recommended Disposition /[] [facility]/  Follow-up with PCP Additional Notes: Pt needs appt for additional refills per last OV note. Pt states cannot afford another OV. Offered [facility], wants to hear from PCP.Care advise provided, please advise. Reason for Disposition  [1] Systolic BP  >= 130 OR Diastolic >= 80 AND [2] taking BP medications  Answer Assessment - Initial Assessment Questions 1. BLOOD PRESSURE: "What is the blood pressure?" "Did you take at least two measurements 5 minutes apart?"     169/99 2. ONSET: "When did you take your blood pressure?"     10am   yesterday 170/103 3. HOW: "How did you obtain the blood pressure?" (e.g., visiting nurse, automatic home BP monitor)     Home monitor 4. HISTORY: "Do you have a history of high blood pressure?"     yes 5. MEDICATIONS: "Are you taking any medications for blood pressure?" "Have you missed any doses recently?"     Last took 1 week ago 6. OTHER SYMPTOMS: "Do you have any symptoms?" (e.g., headache, chest pain, blurred vision, difficulty breathing, weakness)     Finger tips not hands tingling  Protocols used: Blood Pressure - High-A-AH

## 2021-11-15 ENCOUNTER — Other Ambulatory Visit: Payer: Self-pay | Admitting: Nurse Practitioner

## 2021-11-15 ENCOUNTER — Other Ambulatory Visit: Payer: Self-pay

## 2021-11-15 DIAGNOSIS — I10 Essential (primary) hypertension: Secondary | ICD-10-CM

## 2021-11-15 NOTE — Telephone Encounter (Signed)
Requested medication (s) are due for refill today: yes  Requested medication (s) are on the active medication list: yes  Last refill:  06/24/21 #90 with 0 RF  Future visit scheduled: 02/01/22  Notes to clinic:  This was refused last week due to needs appt. She has appt scheduled 02/01/22, her labs are from 2021, please assess.      Requested Prescriptions  Pending Prescriptions Disp Refills   losartan-hydrochlorothiazide (HYZAAR) 100-25 MG tablet 90 tablet 0    Sig: TAKE 1 TABLET BY MOUTH DAILY.     Cardiovascular: ARB + Diuretic Combos Failed - 11/15/2021 11:57 AM      Failed - K in normal range and within 180 days    Potassium  Date Value Ref Range Status  06/03/2020 4.2 3.5 - 5.2 mmol/L Final         Failed - Na in normal range and within 180 days    Sodium  Date Value Ref Range Status  06/03/2020 140 134 - 144 mmol/L Final         Failed - Cr in normal range and within 180 days    Creat  Date Value Ref Range Status  04/22/2013 0.92 0.50 - 1.10 mg/dL Final   Creatinine, Ser  Date Value Ref Range Status  06/03/2020 0.95 0.57 - 1.00 mg/dL Final   Creatinine, POC  Date Value Ref Range Status  05/11/2017 200 mg/dL Final         Failed - eGFR is 10 or above and within 180 days    GFR calc Af Amer  Date Value Ref Range Status  06/03/2020 80 >59 mL/min/1.73 Final    Comment:    **In accordance with recommendations from the NKF-ASN Task force,**   [facility] is in the process of updating its eGFR calculation to the   2021 CKD-EPI creatinine equation that estimates kidney function   without a race variable.    GFR calc non Af Amer  Date Value Ref Range Status  06/03/2020 70 >59 mL/min/1.73 Final         Failed - Last BP in normal range    BP Readings from Last 1 Encounters:  08/13/21 (!) 158/101         Passed - Patient is not pregnant      Passed - Valid encounter within last 6 months    Recent Outpatient Visits           2 months ago Primary hypertension    [place], Vernia Buff, NP   4 months ago Accelerated hypertension   [place], Vernia Buff, NP   1 year ago Hypertensive urgency, malignant   [place], [place], [place]   2 years ago Hypertensive urgency, malignant   [place], [place]   4 years ago Malignant hypertension   [place], Maylon Peppers, FNP       Future Appointments             In 2 months Gildardo Pounds, NP [facility]

## 2021-11-15 NOTE — Telephone Encounter (Unsigned)
Copied from CRM [id number]. Topic: General - Other >> Nov 15, 2021 12:03 PM Everette C wrote: Reason for CRM: Medication Refill - Medication: amLODipine (NORVASC) 10 MG tablet [[id number]]  ENDED  Rx #: [id number]  losartan-hydrochlorothiazide (HYZAAR) 100-25 MG tablet [[id number]] - patient has 0 tablets remaining   Has the patient contacted their pharmacy? Yes.  The patient has been directed by their pharmacy to contact their PCP. The patient has scheduled an rx follow up appointment  (Agent: If no, request that the patient contact the pharmacy for the refill. If patient does not wish to contact the pharmacy document the reason why and proceed with request.) (Agent: If yes, when and what did the pharmacy advise?)  Preferred Pharmacy (with phone number or street name): [id number] - [place] [place] - [address] AT [facility] [address] [place] [address] Phone: [phone] Fax: [phone] Hours: Open 24 hours  Has the patient been seen for an appointment in the last year OR does the patient have an upcoming appointment? Yes.    Agent: Please be advised that RX refills may take up to 3 business days. We ask that you follow-up with your pharmacy.

## 2021-11-16 ENCOUNTER — Other Ambulatory Visit: Payer: Self-pay

## 2021-11-16 ENCOUNTER — Other Ambulatory Visit: Payer: Self-pay | Admitting: Nurse Practitioner

## 2021-11-16 MED ORDER — AMLODIPINE BESYLATE 10 MG PO TABS: 10.0000 mg | ORAL_TABLET | Freq: Every day | ORAL | 0 refills | Status: DC

## 2021-11-16 NOTE — Telephone Encounter (Signed)
Called pt and she is aware  ?

## 2021-11-16 NOTE — Telephone Encounter (Signed)
Amlodipine rx sent. Patient has not had labs since 2021. Will defer losartan-HCTZ to PCP.

## 2021-11-16 NOTE — Telephone Encounter (Signed)
Medication was sent today.

## 2021-11-17 NOTE — Telephone Encounter (Signed)
Requested medication (s) are due for refill today: yes  Requested medication (s) are on the active medication list: yes  Last refill:  06/24/21 #90 with 0 RF  Future visit scheduled: 02/01/22  Notes to clinic:  Labs are from 2021, has upcoming appt, please assess.    Requested Prescriptions  Pending Prescriptions Disp Refills   losartan-hydrochlorothiazide (HYZAAR) 50-12.5 MG tablet [Pharmacy Med Name: LOSARTAN-HCTZ 50-12.5 MG TAB] 30 tablet 1    Sig: TAKE 1 TABLET BY MOUTH EVERY DAY     Cardiovascular: ARB + Diuretic Combos Failed - 11/16/2021  6:50 PM      Failed - K in normal range and within 180 days    Potassium  Date Value Ref Range Status  06/03/2020 4.2 3.5 - 5.2 mmol/L Final         Failed - Na in normal range and within 180 days    Sodium  Date Value Ref Range Status  06/03/2020 140 134 - 144 mmol/L Final         Failed - Cr in normal range and within 180 days    Creat  Date Value Ref Range Status  04/22/2013 0.92 0.50 - 1.10 mg/dL Final   Creatinine, Ser  Date Value Ref Range Status  06/03/2020 0.95 0.57 - 1.00 mg/dL Final   Creatinine, POC  Date Value Ref Range Status  05/11/2017 200 mg/dL Final         Failed - eGFR is 10 or above and within 180 days    GFR calc Af Amer  Date Value Ref Range Status  06/03/2020 80 >59 mL/min/1.73 Final    Comment:    **In accordance with recommendations from the NKF-ASN Task force,**   [facility] is in the process of updating its eGFR calculation to the   2021 CKD-EPI creatinine equation that estimates kidney function   without a race variable.    GFR calc non Af Amer  Date Value Ref Range Status  06/03/2020 70 >59 mL/min/1.73 Final         Failed - Last BP in normal range    BP Readings from Last 1 Encounters:  08/13/21 (!) 158/101         Passed - Patient is not pregnant      Passed - Valid encounter within last 6 months    Recent Outpatient Visits           2 months ago Primary hypertension   [place] [facility], Melissa Buff, NP   4 months ago Accelerated hypertension   [place], Melissa Buff, NP   1 year ago Hypertensive urgency, malignant   [place], [name], [name]   2 years ago Hypertensive urgency, malignant   [place], MD   4 years ago Malignant hypertension   [place], Melissa Peppers, FNP       Future Appointments             In 2 months Gildardo Pounds, NP [place]

## 2021-11-18 ENCOUNTER — Other Ambulatory Visit: Payer: Self-pay | Admitting: Nurse Practitioner

## 2021-11-18 ENCOUNTER — Other Ambulatory Visit: Payer: Commercial Managed Care - HMO

## 2021-11-18 ENCOUNTER — Other Ambulatory Visit: Payer: Self-pay

## 2021-11-18 DIAGNOSIS — Z8673 Personal history of transient ischemic attack (TIA), and cerebral infarction without residual deficits: Secondary | ICD-10-CM

## 2021-11-18 DIAGNOSIS — R7303 Prediabetes: Secondary | ICD-10-CM

## 2021-11-18 DIAGNOSIS — I10 Essential (primary) hypertension: Secondary | ICD-10-CM

## 2021-11-18 DIAGNOSIS — R7989 Other specified abnormal findings of blood chemistry: Secondary | ICD-10-CM

## 2021-11-18 DIAGNOSIS — Z1159 Encounter for screening for other viral diseases: Secondary | ICD-10-CM

## 2021-11-18 DIAGNOSIS — Z1211 Encounter for screening for malignant neoplasm of colon: Secondary | ICD-10-CM

## 2021-11-21 ENCOUNTER — Ambulatory Visit: Payer: Commercial Managed Care - HMO | Attending: Nurse Practitioner

## 2021-11-22 LAB — CMP14+EGFR
ALT: 18 IU/L (ref 0–32)
AST: 26 IU/L (ref 0–40)
Albumin/Globulin Ratio: 1.6 (ref 1.2–2.2)
Albumin: 4.7 g/dL (ref 3.8–4.9)
Alkaline Phosphatase: 53 IU/L (ref 44–121)
BUN/Creatinine Ratio: 23 (ref 9–23)
BUN: 20 mg/dL (ref 6–24)
Bilirubin Total: 0.4 mg/dL (ref 0.0–1.2)
CO2: 23 mmol/L (ref 20–29)
Calcium: 9.7 mg/dL (ref 8.7–10.2)
Chloride: 104 mmol/L (ref 96–106)
Creatinine, Ser: 0.86 mg/dL (ref 0.57–1.00)
Globulin, Total: 3 g/dL (ref 1.5–4.5)
Glucose: 94 mg/dL (ref 70–99)
Potassium: 4.5 mmol/L (ref 3.5–5.2)
Sodium: 140 mmol/L (ref 134–144)
Total Protein: 7.7 g/dL (ref 6.0–8.5)
eGFR: 81 mL/min/{1.73_m2} (ref >59)

## 2021-11-22 LAB — CBC
Hematocrit: 35.7 % (ref 34.0–46.6)
Hemoglobin: 12 g/dL (ref 11.1–15.9)
MCH: 32.4 pg (ref 26.6–33.0)
MCHC: 33.6 g/dL (ref 31.5–35.7)
MCV: 97 fL (ref 79–97)
Platelets: 255 10*3/uL (ref 150–450)
RBC: 3.7 x10E6/uL — ABNORMAL LOW (ref 3.77–5.28)
RDW: 12.8 % (ref 11.7–15.4)
WBC: 6.2 10*3/uL (ref 3.4–10.8)

## 2021-11-22 LAB — HCV AB W REFLEX TO QUANT PCR: HCV Ab: NONREACTIVE

## 2021-11-22 LAB — HEMOGLOBIN A1C
Est. average glucose Bld gHb Est-mCnc: 120 mg/dL
Hgb A1c MFr Bld: 5.8 % — ABNORMAL HIGH (ref 4.8–5.6)

## 2021-11-22 LAB — LIPID PANEL
Chol/HDL Ratio: 2.4 ratio (ref 0.0–4.4)
Cholesterol, Total: 164 mg/dL (ref 100–199)
HDL: 67 mg/dL (ref >39)
LDL Chol Calc (NIH): 86 mg/dL (ref 0–99)
Triglycerides: 55 mg/dL (ref 0–149)
VLDL Cholesterol Cal: 11 mg/dL (ref 5–40)

## 2021-11-22 LAB — HCV INTERPRETATION

## 2021-12-05 ENCOUNTER — Ambulatory Visit: Payer: Commercial Managed Care - HMO | Attending: Nurse Practitioner | Admitting: Nurse Practitioner

## 2021-12-05 ENCOUNTER — Other Ambulatory Visit (HOSPITAL_COMMUNITY)
Admission: RE | Admit: 2021-12-05 | Discharge: 2021-12-05 | Disposition: A | Discharge status: Home / Self Care | Payer: Commercial Managed Care - HMO | Source: Ambulatory Visit | Attending: Nurse Practitioner | Admitting: Nurse Practitioner

## 2021-12-05 ENCOUNTER — Encounter: Payer: Self-pay | Admitting: Nurse Practitioner

## 2021-12-05 VITALS — BP 172/99 | HR 64 | Temp 98.8°F | Wt 130.0 lb

## 2021-12-05 DIAGNOSIS — I10 Essential (primary) hypertension: Secondary | ICD-10-CM

## 2021-12-05 DIAGNOSIS — Z124 Encounter for screening for malignant neoplasm of cervix: Secondary | ICD-10-CM | POA: Insufficient documentation

## 2021-12-05 MED ORDER — AMLODIPINE BESYLATE 10 MG PO TABS: 10.0000 mg | ORAL_TABLET | Freq: Every day | ORAL | 1 refills | Status: DC

## 2021-12-05 MED ORDER — LOSARTAN POTASSIUM-HCTZ 100-25 MG PO TABS: 1.0000 | ORAL_TABLET | Freq: Every day | ORAL | 1 refills | Status: DC

## 2021-12-05 NOTE — Progress Notes (Signed)
Only taking one medication for BP

## 2021-12-05 NOTE — Progress Notes (Signed)
Assessment & Plan:  [name] was seen today for gynecologic exam.  Diagnoses and all orders for this visit:  Encounter for Papanicolaou smear for cervical cancer screening -     Cytology - PAP([place]) -     Cervicovaginal ancillary only  Accelerated hypertension -     amLODipine (NORVASC) 10 MG tablet; Take 1 tablet (10 mg total) by mouth daily. -     losartan-hydrochlorothiazide (HYZAAR) 100-25 MG tablet; TAKE 1 TABLET BY MOUTH DAILY.    Patient has been counseled on age-appropriate routine health concerns for screening and prevention. These are reviewed and up-to-date. Referrals have been placed accordingly. Immunizations are up-to-date or declined.    Subjective:   Chief Complaint  Patient presents with   Gynecologic Exam    [name] 51 y.o. female presents to office today for PAP smear.   HTN Blood pressure is elevated today. Unfortunately she has not been taking Hyzaar 100-25 mg daily and only taking amlodipine 10 mg daily. She was instructed to take both hyzaar and amlodipine daily as prescribed.  BP Readings from Last 3 Encounters:  12/05/21 (!) 172/99  08/13/21 (!) 158/101  03/11/21 129/90     Review of Systems  Constitutional: Negative.  Negative for chills, fever, malaise/fatigue and weight loss.  Respiratory: Negative.  Negative for cough, shortness of breath and wheezing.   Cardiovascular: Negative.  Negative for chest pain, orthopnea and leg swelling.  Gastrointestinal:  Negative for abdominal pain.  Genitourinary: Negative.  Negative for flank pain.  Skin: Negative.  Negative for rash.  Psychiatric/Behavioral:  Negative for suicidal ideas.     Past Medical History:  Diagnosis Date   Hypertension     Past Surgical History:  Procedure Laterality Date   CESAREAN SECTION     TUBAL LIGATION      Family History  Problem Relation Age of Onset   Hypertension Mother    Diabetes Mellitus II Mother    Hypertension Father    Diabetes  Mellitus II Father    Hypertension Sister    Lung cancer Other     Social History Reviewed with no changes to be made today.   Outpatient Medications Prior to Visit  Medication Sig Dispense Refill   amLODipine (NORVASC) 10 MG tablet Take 1 tablet (10 mg total) by mouth daily. 90 tablet 0   cetirizine (ZYRTEC ALLERGY) 10 MG tablet Take 1 tablet (10 mg total) by mouth daily for 10 days. 20 tablet 0   losartan-hydrochlorothiazide (HYZAAR) 100-25 MG tablet TAKE 1 TABLET BY MOUTH DAILY. (Patient not taking: Reported on 12/05/2021) 90 tablet 0   No facility-administered medications prior to visit.    No Known Allergies     Objective:    BP (!) 172/99   Pulse 64   Temp 98.8 F (37.1 C) (Oral)   Wt 130 lb (59 kg)   LMP 08/07/2017 (Exact Date)   SpO2 98%   BMI 20.36 kg/m  Wt Readings from Last 3 Encounters:  12/05/21 130 lb (59 kg)  06/03/20 132 lb (59.9 kg)  07/18/19 139 lb (63 kg)    Physical Exam Exam conducted with a chaperone present.  Constitutional:      Appearance: She is well-developed.  HENT:     Head: Normocephalic.  Cardiovascular:     Rate and Rhythm: Normal rate and regular rhythm.     Heart sounds: Normal heart sounds.  Pulmonary:     Effort: Pulmonary effort is normal.     Breath  sounds: Normal breath sounds.  Abdominal:     General: Bowel sounds are normal.     Palpations: Abdomen is soft.     Hernia: There is no hernia in the left inguinal area.  Genitourinary:    Exam position: Lithotomy position.     Labia:        Right: No rash, tenderness, lesion or injury.        Left: No rash, tenderness, lesion or injury.      Vagina: Normal. No signs of injury and foreign body. No vaginal discharge, erythema, tenderness or bleeding.     Cervix: Normal.     Uterus: Not deviated and not enlarged.      Adnexa:        Right: No mass, tenderness or fullness.         Left: No mass, tenderness or fullness.       Rectum: Normal. No external hemorrhoid.   Lymphadenopathy:     Lower Body: No right inguinal adenopathy. No left inguinal adenopathy.  Skin:    General: Skin is warm and dry.  Neurological:     Mental Status: She is alert and oriented to person, place, and time.  Psychiatric:        Behavior: Behavior normal.        Thought Content: Thought content normal.        Judgment: Judgment normal.          Patient has been counseled extensively about nutrition and exercise as well as the importance of adherence with medications and regular follow-up. The patient was given clear instructions to go to ER or return to medical center if symptoms don't improve, worsen or new problems develop. The patient verbalized understanding.   Follow-up: Return in about 4 weeks (around 01/02/2022) for BP CHECK WITH LUKE4 weeks. See me in October .   Claiborne Rigg, FNP-BC [facility] [place], [place] [phone]   12/05/2021, 9:53 AM

## 2021-12-08 LAB — CERVICOVAGINAL ANCILLARY ONLY
Bacterial Vaginitis (gardnerella): POSITIVE — AB
Candida Glabrata: NEGATIVE
Candida Vaginitis: NEGATIVE
Chlamydia: NEGATIVE
Comment: NEGATIVE
Comment: NEGATIVE
Comment: NEGATIVE
Comment: NEGATIVE
Comment: NEGATIVE
Comment: NORMAL
Neisseria Gonorrhea: NEGATIVE
Trichomonas: NEGATIVE

## 2021-12-08 LAB — CYTOLOGY - PAP
Adequacy: ABSENT
Comment: NEGATIVE
Diagnosis: NEGATIVE
High risk HPV: NEGATIVE

## 2021-12-09 ENCOUNTER — Other Ambulatory Visit: Payer: Self-pay | Admitting: Nurse Practitioner

## 2021-12-09 DIAGNOSIS — B9689 Other specified bacterial agents as the cause of diseases classified elsewhere: Secondary | ICD-10-CM

## 2021-12-09 MED ORDER — METRONIDAZOLE 500 MG PO TABS: 500.0000 mg | ORAL_TABLET | Freq: Two times a day (BID) | ORAL | 0 refills | Status: AC
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
## 2022-01-12 ENCOUNTER — Ambulatory Visit: Payer: Commercial Managed Care - HMO | Admitting: Pharmacist

## 2022-01-18 ENCOUNTER — Other Ambulatory Visit: Payer: Self-pay

## 2022-01-18 NOTE — Progress Notes (Signed)
Patient appearing on report for [employer] - Hypertension Control report due to last documented ambulatory blood pressure of 172/99 mmHg on 12/05/2021. Next appointment with PCP is 02/01/2022.   Outreached patient to discuss hypertension control and medication management.   Current antihypertensives: none. Patient has implemented holistic approaches and dietary changes.   Patient has an automated upper arm home BP machine.  Current blood pressure readings: 128/85 mmHg. Has a log she will bring to her visit.   Current meal patterns: using less seasoning and salt; daily shake with spinach, tumeric, leafy greens, pineapple, etc.    Assessment/Plan: - Currently controlled based on home readings - Praised patient for the lifestyle changes she has implemented.  - Reviewed goal blood pressure <140/90 - Reviewed to check blood pressure daily, document, and provide at next provider visit  Valeda Malm, Pharm.D. PGY-2 Ambulatory Care Pharmacy Resident 01/18/2022 2:29 PM

## 2022-02-01 ENCOUNTER — Ambulatory Visit: Payer: Commercial Managed Care - HMO | Admitting: Nurse Practitioner
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
## 2022-02-16 ENCOUNTER — Ambulatory Visit: Payer: Self-pay | Admitting: Pharmacist

## 2022-03-25 ENCOUNTER — Ambulatory Visit (HOSPITAL_COMMUNITY)
Admission: EM | Admit: 2022-03-25 | Discharge: 2022-03-25 | Disposition: A | Discharge status: Home / Self Care | Payer: No Typology Code available for payment source | Attending: Emergency Medicine | Admitting: Emergency Medicine

## 2022-03-25 ENCOUNTER — Encounter (HOSPITAL_COMMUNITY): Payer: Self-pay

## 2022-03-25 DIAGNOSIS — I10 Essential (primary) hypertension: Secondary | ICD-10-CM

## 2022-03-25 DIAGNOSIS — K047 Periapical abscess without sinus: Secondary | ICD-10-CM

## 2022-03-25 MED ORDER — KETOROLAC TROMETHAMINE 30 MG/ML IJ SOLN
30.0000 mg | Freq: Once | INTRAMUSCULAR | Status: AC
  Administered 2022-03-25: 30 mg via INTRAMUSCULAR

## 2022-03-25 MED ORDER — IBUPROFEN 600 MG PO TABS: 600.0000 mg | ORAL_TABLET | Freq: Four times a day (QID) | ORAL | 0 refills | Status: DC | PRN

## 2022-03-25 MED ORDER — AMOXICILLIN-POT CLAVULANATE 875-125 MG PO TABS: 1.0000 | ORAL_TABLET | Freq: Two times a day (BID) | ORAL | 0 refills | Status: DC

## 2022-03-25 MED ORDER — KETOROLAC TROMETHAMINE 30 MG/ML IJ SOLN
INTRAMUSCULAR | Status: AC
  Filled 2022-03-25: qty 1

## 2022-03-25 NOTE — ED Triage Notes (Signed)
Pt presents with dental pain and swelling for several days. Pt has been taking boyfriend Amoxicillin.
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
## 2022-03-25 NOTE — Discharge Instructions (Addendum)
Augmentin has been sent to the pharmacy, you will take this medication 2 times daily for the next 7 days. As discussed, I think it is very important that you follow-up with a dentist.  I have attached some low-cost dentists to the back your paperwork.   I have sent some ibuprofen to the pharmacy, take this every 6 hours as needed for the dental pain.   As discussed, antibiotics can cause GI side effects, it would be best to take the medication with food.  Due to dental pain, it would be best to eat softer foods.   Please start monitoring your blood pressure at home, blood pressure should be below 140/90.

## 2022-11-06 ENCOUNTER — Ambulatory Visit
Admission: EM | Admit: 2022-11-06 | Discharge: 2022-11-06 | Disposition: A | Discharge status: Home / Self Care | Payer: Medicaid Other

## 2022-11-06 DIAGNOSIS — I10 Essential (primary) hypertension: Secondary | ICD-10-CM | POA: Diagnosis not present

## 2022-11-06 DIAGNOSIS — K047 Periapical abscess without sinus: Secondary | ICD-10-CM | POA: Diagnosis not present

## 2022-11-06 DIAGNOSIS — R220 Localized swelling, mass and lump, head: Secondary | ICD-10-CM | POA: Diagnosis not present

## 2022-11-06 MED ORDER — KETOROLAC TROMETHAMINE 15 MG/ML IJ SOLN
15.0000 mg | Freq: Once | INTRAMUSCULAR | Status: AC
  Administered 2022-11-06: 15 mg via INTRAMUSCULAR

## 2022-11-06 MED ORDER — SULFAMETHOXAZOLE-TRIMETHOPRIM 800-160 MG PO TABS: 1.0000 | ORAL_TABLET | Freq: Two times a day (BID) | ORAL | 0 refills | Status: DC

## 2022-11-06 MED ORDER — CEFTRIAXONE SODIUM 500 MG IJ SOLR
500.0000 mg | Freq: Once | INTRAMUSCULAR | Status: AC
  Administered 2022-11-06: 500 mg via INTRAMUSCULAR

## 2022-11-06 MED ORDER — KETOROLAC TROMETHAMINE 30 MG/ML IJ SOLN: 30.0000 mg | Freq: Once | INTRAMUSCULAR | Status: DC

## 2022-11-06 MED ORDER — PREDNISONE 10 MG PO TABS: 20.0000 mg | ORAL_TABLET | Freq: Every day | ORAL | 0 refills | Status: AC

## 2022-11-06 NOTE — ED Triage Notes (Signed)
Patient here today with c/o upper right toothache since yesterday. She has facial swelling. She took IBU today but did not help with the pain.

## 2022-11-06 NOTE — Discharge Instructions (Addendum)
Do not take any more Ibuprofen today while taking the prednisone.  You can take extra strength tylenol as needed for pain. Take only 10 mg of prednisone tonight which is 1 tablet then start tomorrow with taking 2 tablets daily and your final dose will only 1 tablet.  Take prednisone with food to avoid upset stomach.  You on double strength Bactrim for the infection in your mouth.  Take the entire course of medication.  If your facial swelling worsens or you notice any severe swelling around your eye that means the antibiotics is not working and you need to go immediately to the emergency department as that is a medical emergency.  Your blood pressure is elevated today continue to monitor your blood pressure at home and take your blood pressure medication as prescribed.
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
## 2022-12-14 ENCOUNTER — Other Ambulatory Visit: Payer: Self-pay | Admitting: Nurse Practitioner

## 2022-12-14 ENCOUNTER — Encounter: Payer: Self-pay | Admitting: Nurse Practitioner

## 2022-12-14 DIAGNOSIS — Z1211 Encounter for screening for malignant neoplasm of colon: Secondary | ICD-10-CM

## 2022-12-29 DIAGNOSIS — Z1211 Encounter for screening for malignant neoplasm of colon: Secondary | ICD-10-CM | POA: Diagnosis not present

## 2023-02-15 ENCOUNTER — Encounter (HOSPITAL_COMMUNITY): Payer: Self-pay

## 2023-02-15 ENCOUNTER — Ambulatory Visit (HOSPITAL_COMMUNITY)
Admission: EM | Admit: 2023-02-15 | Discharge: 2023-02-15 | Disposition: A | Discharge status: Home / Self Care | Payer: Medicaid Other | Attending: Nurse Practitioner | Admitting: Nurse Practitioner

## 2023-02-15 DIAGNOSIS — R200 Anesthesia of skin: Secondary | ICD-10-CM | POA: Diagnosis not present

## 2023-02-15 DIAGNOSIS — R202 Paresthesia of skin: Secondary | ICD-10-CM | POA: Diagnosis not present

## 2023-02-15 DIAGNOSIS — K047 Periapical abscess without sinus: Secondary | ICD-10-CM | POA: Diagnosis not present

## 2023-02-15 DIAGNOSIS — K029 Dental caries, unspecified: Secondary | ICD-10-CM

## 2023-02-15 MED ORDER — AMOXICILLIN-POT CLAVULANATE 875-125 MG PO TABS: 1.0000 | ORAL_TABLET | Freq: Two times a day (BID) | ORAL | 0 refills | Status: AC

## 2023-02-15 MED ORDER — KETOROLAC TROMETHAMINE 30 MG/ML IJ SOLN
INTRAMUSCULAR | Status: AC
  Filled 2023-02-15: qty 1

## 2023-02-15 MED ORDER — KETOROLAC TROMETHAMINE 30 MG/ML IJ SOLN
30.0000 mg | Freq: Once | INTRAMUSCULAR | Status: AC
  Administered 2023-02-15: 30 mg via INTRAMUSCULAR
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
## 2023-02-15 NOTE — Discharge Instructions (Addendum)
We have given you a shot of Toradol today to help with the dental pain.  Please are taking the Augmentin twice daily for 7 days as prescribed to treat the dental infection.  Please follow-up with a dentist-dental resources have been given today.  Avoid NSAIDs for 48 hours, you can take Tylenol 500 to 1000 mg every 6 hours for pain and also apply ice to the outside of your face to help with swelling and pain.  Also gave you wrist braces for both of your wrists today.  Recommend wearing them at nighttime to help with this is carpal tunnel syndrome.  Follow-up with PCP if pain persist despite treatment.

## 2023-02-15 NOTE — ED Triage Notes (Signed)
Pt reports tooth pain and swelling. Pt reports bilateral hand for several months. Pt thinks she has carpel tunnel.

## 2023-03-13 ENCOUNTER — Ambulatory Visit: Payer: Medicaid Other | Admitting: Nurse Practitioner

## 2023-04-16 ENCOUNTER — Ambulatory Visit: Payer: Medicaid Other | Admitting: Nurse Practitioner

## 2023-06-08 ENCOUNTER — Encounter: Payer: Self-pay | Admitting: Nurse Practitioner

## 2023-06-08 ENCOUNTER — Ambulatory Visit: Payer: Medicaid Other | Attending: Nurse Practitioner | Admitting: Nurse Practitioner

## 2023-06-08 VITALS — BP 171/88 | HR 68 | Resp 19 | Ht 66.5 in | Wt 127.0 lb

## 2023-06-08 DIAGNOSIS — E785 Hyperlipidemia, unspecified: Secondary | ICD-10-CM

## 2023-06-08 DIAGNOSIS — R2231 Localized swelling, mass and lump, right upper limb: Secondary | ICD-10-CM

## 2023-06-08 DIAGNOSIS — R7303 Prediabetes: Secondary | ICD-10-CM | POA: Diagnosis not present

## 2023-06-08 DIAGNOSIS — J3089 Other allergic rhinitis: Secondary | ICD-10-CM | POA: Diagnosis not present

## 2023-06-08 DIAGNOSIS — I10 Essential (primary) hypertension: Secondary | ICD-10-CM | POA: Diagnosis not present

## 2023-06-08 DIAGNOSIS — H5789 Other specified disorders of eye and adnexa: Secondary | ICD-10-CM

## 2023-06-08 DIAGNOSIS — R7989 Other specified abnormal findings of blood chemistry: Secondary | ICD-10-CM

## 2023-06-08 MED ORDER — LOSARTAN POTASSIUM-HCTZ 100-25 MG PO TABS: 1.0000 | ORAL_TABLET | Freq: Every day | ORAL | 1 refills | Status: DC

## 2023-06-08 MED ORDER — CETIRIZINE HCL 10 MG PO TABS: 10.0000 mg | ORAL_TABLET | Freq: Every day | ORAL | 1 refills | Status: DC

## 2023-06-08 NOTE — Progress Notes (Signed)
 Assessment & Plan:  [name] was seen today for medical management of chronic issues.  Diagnoses and all orders for this visit:  Primary hypertension -     CMP14+EGFR -     losartan -hydrochlorothiazide  (HYZAAR ) 100-25 MG tablet; Take 1 tablet by mouth daily. Continue all antihypertensives as prescribed.  Reminded to bring in blood pressure log for follow  up appointment.  RECOMMENDATIONS: DASH/Mediterranean Diets are healthier choices for HTN.    Prediabetes -     Hemoglobin A1c -     CMP14+EGFR  Nodule of skin of right thumb -     Ambulatory referral to Hand Surgery  Eye swelling, left -     Ambulatory referral to Ophthalmology  Abnormal CBC -     CBC with Differential  Dyslipidemia, goal LDL below 100 -     Lipid panel  Environmental and seasonal allergies -     cetirizine  (ZYRTEC  ALLERGY) 10 MG tablet; Take 1 tablet (10 mg total) by mouth daily.    Patient has been counseled on age-appropriate routine health concerns for screening and prevention. These are reviewed and up-to-date. Referrals have been placed accordingly. Immunizations are up-to-date or declined.    Subjective:   Chief Complaint  Patient presents with   Medical Management of Chronic Issues    [name] 53 y.o. female presents to office today for follow up to HTN. I have not seen her in this office since 2023.   She has a history of HTN   HTN States blood pressure was doing fine at home. Readings on home blood pressure device today in office are elevated with average readings 140/80s. She is no longer taking amlodipine  or hyzaar . States her blood pressure has been controlled with with herbal supplements at home in the past.  BP Readings from Last 3 Encounters:  06/08/23 (!) 171/88  02/15/23 (!) 198/108  11/06/22 (!) 199/104    She has mild left upper lid periorbital edema. No  visual changes however she does endorse excessive eye tearing bilaterally.  There is a subcutaneous nodule  on the lower right thumb that has been present for years and slowly increasing in size. There is some tenderness present at times.   Prediabetes A1c at goal. She is not taking any diabetes medications. Endorses numbness and tingling in several of her fingertips,   Review of Systems  Constitutional:  Negative for fever, malaise/fatigue and weight loss.  HENT: Negative.  Negative for nosebleeds.   Eyes:  Negative for blurred vision, double vision and photophobia.       SEE HPI  Respiratory: Negative.  Negative for cough and shortness of breath.   Cardiovascular: Negative.  Negative for chest pain, palpitations and leg swelling.  Gastrointestinal: Negative.  Negative for heartburn, nausea and vomiting.  Musculoskeletal: Negative.  Negative for myalgias.  Skin:        SEE HPI  Neurological: Negative.  Negative for dizziness, focal weakness, seizures and headaches.  Endo/Heme/Allergies:  Positive for environmental allergies.  Psychiatric/Behavioral: Negative.  Negative for suicidal ideas.     Past Medical History:  Diagnosis Date   Hypertension     Past Surgical History:  Procedure Laterality Date   CESAREAN SECTION     TUBAL LIGATION      Family History  Problem Relation Age of Onset   Hypertension Mother    Diabetes Mellitus II Mother    Hypertension Father    Diabetes Mellitus II Father    Hypertension Sister  Lung cancer Other     Social History Reviewed with no changes to be made today.   Outpatient Medications Prior to Visit  Medication Sig Dispense Refill   amLODipine  (NORVASC ) 10 MG tablet Take 1 tablet (10 mg total) by mouth daily. (Patient not taking: Reported on 06/08/2023) 90 tablet 1   cetirizine  (ZYRTEC  ALLERGY) 10 MG tablet Take 1 tablet (10 mg total) by mouth daily for 10 days. (Patient not taking: Reported on 06/08/2023) 20 tablet 0   losartan -hydrochlorothiazide  (HYZAAR ) 100-25 MG tablet TAKE 1 TABLET BY MOUTH DAILY. (Patient not taking: Reported on  06/08/2023) 90 tablet 1   sulfamethoxazole -trimethoprim  (BACTRIM  DS) 800-160 MG tablet Take 1 tablet by mouth 2 (two) times daily. 20 tablet 0   No facility-administered medications prior to visit.    No Known Allergies     Objective:    BP (!) 171/88 (BP Location: Left Arm, Patient Position: Sitting, Cuff Size: Small) Comment: Patients BP machine reading- 158/87, P-66  Pulse 68   Resp 19   Ht 5' 6.5 (1.689 m)   Wt 127 lb (57.6 kg)   LMP 08/07/2017 (Exact Date)   SpO2 100%   BMI 20.19 kg/m  Wt Readings from Last 3 Encounters:  06/08/23 127 lb (57.6 kg)  11/06/22 130 lb (59 kg)  12/05/21 130 lb (59 kg)    Physical Exam Vitals and nursing note reviewed.  Constitutional:      Appearance: She is well-developed.  HENT:     Head: Normocephalic and atraumatic.  Eyes:     General:        Right eye: No foreign body, discharge or hordeolum.        Left eye: No foreign body, discharge or hordeolum.     Conjunctiva/sclera:     Right eye: Right conjunctiva is not injected. No chemosis, exudate or hemorrhage.    Left eye: Left conjunctiva is not injected. No chemosis, exudate or hemorrhage.     Comments: Periorbital swelling  Cardiovascular:     Rate and Rhythm: Normal rate and regular rhythm.     Heart sounds: Normal heart sounds. No murmur heard.    No friction rub. No gallop.  Pulmonary:     Effort: Pulmonary effort is normal. No tachypnea or respiratory distress.     Breath sounds: Normal breath sounds. No decreased breath sounds, wheezing, rhonchi or rales.  Chest:     Chest wall: No tenderness.  Abdominal:     General: Bowel sounds are normal.     Palpations: Abdomen is soft.  Musculoskeletal:        General: Normal range of motion.     Cervical back: Normal range of motion.  Skin:    General: Skin is warm and dry.  Neurological:     Mental Status: She is alert and oriented to person, place, and time.     Coordination: Coordination normal.  Psychiatric:         Behavior: Behavior normal. Behavior is cooperative.        Thought Content: Thought content normal.        Judgment: Judgment normal.          Patient has been counseled extensively about nutrition and exercise as well as the importance of adherence with medications and regular follow-up. The patient was given clear instructions to go to ER or return to medical center if symptoms don't improve, worsen or new problems develop. The patient verbalized understanding.   Follow-up: Return in about 3 months (  around 09/06/2023).   Haze LELON Servant, FNP-BC [facility] [place], [place] [phone]   06/08/2023, 7:23 PM

## 2023-06-09 LAB — CBC WITH DIFFERENTIAL/PLATELET
Basophils Absolute: 0 10*3/uL (ref 0.0–0.2)
Basos: 1 %
EOS (ABSOLUTE): 0.1 10*3/uL (ref 0.0–0.4)
Eos: 1 %
Hematocrit: 37.3 % (ref 34.0–46.6)
Hemoglobin: 12.3 g/dL (ref 11.1–15.9)
Immature Grans (Abs): 0 10*3/uL (ref 0.0–0.1)
Immature Granulocytes: 0 %
Lymphocytes Absolute: 3.1 10*3/uL (ref 0.7–3.1)
Lymphs: 47 %
MCH: 31.9 pg (ref 26.6–33.0)
MCHC: 33 g/dL (ref 31.5–35.7)
MCV: 97 fL (ref 79–97)
Monocytes Absolute: 0.4 10*3/uL (ref 0.1–0.9)
Monocytes: 6 %
Neutrophils Absolute: 3 10*3/uL (ref 1.4–7.0)
Neutrophils: 45 %
Platelets: 265 10*3/uL (ref 150–450)
RBC: 3.86 x10E6/uL (ref 3.77–5.28)
RDW: 12.3 % (ref 11.7–15.4)
WBC: 6.6 10*3/uL (ref 3.4–10.8)

## 2023-06-09 LAB — CMP14+EGFR
ALT: 21 [IU]/L (ref 0–32)
AST: 23 [IU]/L (ref 0–40)
Albumin: 4.2 g/dL (ref 3.8–4.9)
Alkaline Phosphatase: 69 [IU]/L (ref 44–121)
BUN/Creatinine Ratio: 15 (ref 9–23)
BUN: 18 mg/dL (ref 6–24)
Bilirubin Total: 0.2 mg/dL (ref 0.0–1.2)
CO2: 24 mmol/L (ref 20–29)
Calcium: 9.4 mg/dL (ref 8.7–10.2)
Chloride: 106 mmol/L (ref 96–106)
Creatinine, Ser: 1.19 mg/dL — ABNORMAL HIGH (ref 0.57–1.00)
Globulin, Total: 3.2 g/dL (ref 1.5–4.5)
Glucose: 83 mg/dL (ref 70–99)
Potassium: 4.6 mmol/L (ref 3.5–5.2)
Sodium: 142 mmol/L (ref 134–144)
Total Protein: 7.4 g/dL (ref 6.0–8.5)
eGFR: 54 mL/min/{1.73_m2} — ABNORMAL LOW (ref >59)

## 2023-06-09 LAB — HEMOGLOBIN A1C
Est. average glucose Bld gHb Est-mCnc: 123 mg/dL
Hgb A1c MFr Bld: 5.9 % — ABNORMAL HIGH (ref 4.8–5.6)

## 2023-06-09 LAB — LIPID PANEL
Chol/HDL Ratio: 2.4 {ratio} (ref 0.0–4.4)
Cholesterol, Total: 166 mg/dL (ref 100–199)
HDL: 69 mg/dL (ref >39)
LDL Chol Calc (NIH): 82 mg/dL (ref 0–99)
Triglycerides: 78 mg/dL (ref 0–149)
VLDL Cholesterol Cal: 15 mg/dL (ref 5–40)

## 2023-06-14 ENCOUNTER — Telehealth: Payer: Self-pay | Admitting: Nurse Practitioner

## 2023-06-14 NOTE — Telephone Encounter (Signed)
 Copied from CRM [id number]. Topic: Referral - Status >> Jun 14, 2023 10:49 AM Tobias CROME wrote: Reason for CRM: Pt informed by Ophthalmology office [facility]) they do not accept her insurance ([employer]). Pt requesting referral to be sent to office that does accept insurance.

## 2023-06-27 DIAGNOSIS — H5213 Myopia, bilateral: Secondary | ICD-10-CM | POA: Diagnosis not present

## 2023-07-09 ENCOUNTER — Ambulatory Visit: Payer: Medicaid Other | Admitting: Orthopedic Surgery

## 2023-07-09 ENCOUNTER — Other Ambulatory Visit (INDEPENDENT_AMBULATORY_CARE_PROVIDER_SITE_OTHER): Payer: Medicaid Other

## 2023-07-09 DIAGNOSIS — M79641 Pain in right hand: Secondary | ICD-10-CM

## 2023-07-09 DIAGNOSIS — S63641D Sprain of metacarpophalangeal joint of right thumb, subsequent encounter: Secondary | ICD-10-CM
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
## 2023-12-01 ENCOUNTER — Other Ambulatory Visit: Payer: Self-pay | Admitting: Nurse Practitioner

## 2023-12-01 DIAGNOSIS — I10 Essential (primary) hypertension: Secondary | ICD-10-CM

## 2023-12-24 ENCOUNTER — Other Ambulatory Visit: Payer: Self-pay | Admitting: Nurse Practitioner

## 2023-12-24 DIAGNOSIS — I10 Essential (primary) hypertension: Secondary | ICD-10-CM

## 2023-12-24 MED ORDER — LOSARTAN POTASSIUM-HCTZ 100-25 MG PO TABS: 1.0000 | ORAL_TABLET | Freq: Every day | ORAL | 0 refills | Status: DC

## 2023-12-24 NOTE — Telephone Encounter (Signed)
 Copied from CRM [id number]. Topic: Clinical - Medication Refill >> Dec 24, 2023  3:14 PM [name] wrote: Medication: losartan -hydrochlorothiazide  (HYZAAR ) 100-25 MG tablet  Has the patient contacted their pharmacy? No (Agent: If no, request that the patient contact the pharmacy for the refill. If patient does not wish to contact the pharmacy document the reason why and proceed with request.) (Agent: If yes, when and what did the pharmacy advise?)  This is the patient's preferred pharmacy:  [id number] - [place], [place] - [address] AT [facility] [address] [place] [address] Phone: [phone] Fax: [phone]  Is this the correct pharmacy for this prescription? Yes If no, delete pharmacy and type the correct one.   Has the prescription been filled recently? Yes  Is the patient out of the medication? No  Has the patient been seen for an appointment in the last year OR does the patient have an upcoming appointment? Yes  Can we respond through MyChart? Yes  Agent: Please be advised that Rx refills may take up to 3 business days. We ask that you follow-up with your pharmacy.

## 2023-12-28 ENCOUNTER — Other Ambulatory Visit: Payer: Self-pay | Admitting: Nurse Practitioner

## 2023-12-28 DIAGNOSIS — I10 Essential (primary) hypertension: Secondary | ICD-10-CM

## 2024-01-28 ENCOUNTER — Ambulatory Visit: Admitting: Nurse Practitioner

## 2024-02-15 ENCOUNTER — Other Ambulatory Visit: Payer: Self-pay

## 2024-02-15 ENCOUNTER — Encounter: Payer: Self-pay | Admitting: *Deleted

## 2024-02-15 ENCOUNTER — Ambulatory Visit
Admission: EM | Admit: 2024-02-15 | Discharge: 2024-02-15 | Disposition: A | Discharge status: Home / Self Care | Attending: Family Medicine | Admitting: Family Medicine

## 2024-02-15 ENCOUNTER — Ambulatory Visit (INDEPENDENT_AMBULATORY_CARE_PROVIDER_SITE_OTHER)

## 2024-02-15 DIAGNOSIS — R0781 Pleurodynia: Secondary | ICD-10-CM

## 2024-02-15 DIAGNOSIS — S2232XA Fracture of one rib, left side, initial encounter for closed fracture: Secondary | ICD-10-CM | POA: Diagnosis not present

## 2024-02-15 DIAGNOSIS — M545 Low back pain, unspecified: Secondary | ICD-10-CM

## 2024-02-15 DIAGNOSIS — W19XXXA Unspecified fall, initial encounter: Secondary | ICD-10-CM

## 2024-02-15 MED ORDER — IBUPROFEN 600 MG PO TABS: 600.0000 mg | ORAL_TABLET | Freq: Four times a day (QID) | ORAL | 0 refills | Status: DC | PRN

## 2024-02-15 MED ORDER — TIZANIDINE HCL 4 MG PO TABS: 4.0000 mg | ORAL_TABLET | Freq: Four times a day (QID) | ORAL | 0 refills | Status: DC | PRN

## 2024-02-15 MED ORDER — KETOROLAC TROMETHAMINE 30 MG/ML IJ SOLN
30.0000 mg | Freq: Once | INTRAMUSCULAR | Status: AC
  Administered 2024-02-15: 30 mg via INTRAMUSCULAR

## 2024-02-15 NOTE — Discharge Instructions (Addendum)
 You were diagnosed with a rib fracture today.  As discussed this has to heal on its own.  I have sent out motrin  and a low dose muscle relaxer to help.  This may  make you tired/drowsy so please take when home and not driving.  You may use heat/ice to the area to help.  This may take up to 6 weeks to heal.  Please avoid heavy lifting or strenuous activity.  Please return if your pain is not improving or worsening.
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
## 2024-02-19 ENCOUNTER — Ambulatory Visit: Admitting: Nurse Practitioner

## 2024-03-10 ENCOUNTER — Ambulatory Visit: Admitting: Nurse Practitioner

## 2024-03-26 ENCOUNTER — Encounter: Payer: Self-pay | Admitting: Nurse Practitioner

## 2024-03-26 ENCOUNTER — Ambulatory Visit: Attending: Nurse Practitioner | Admitting: Nurse Practitioner

## 2024-03-26 VITALS — BP 108/68 | HR 62 | Resp 18 | Ht 66.5 in | Wt 119.8 lb

## 2024-03-26 DIAGNOSIS — I10 Essential (primary) hypertension: Secondary | ICD-10-CM | POA: Diagnosis not present

## 2024-03-26 DIAGNOSIS — R634 Abnormal weight loss: Secondary | ICD-10-CM | POA: Diagnosis not present

## 2024-03-26 DIAGNOSIS — K089 Disorder of teeth and supporting structures, unspecified: Secondary | ICD-10-CM

## 2024-03-26 DIAGNOSIS — R7303 Prediabetes: Secondary | ICD-10-CM

## 2024-03-26 DIAGNOSIS — Z1231 Encounter for screening mammogram for malignant neoplasm of breast: Secondary | ICD-10-CM | POA: Diagnosis not present

## 2024-03-26 NOTE — Progress Notes (Signed)
 Assessment & Plan:  [name] was seen today for hypertension.  Diagnoses and all orders for this visit:  Primary hypertension -     CMP14+EGFR Blood pressure controlled with Hyzaar  100-25 mg daily   Poor dentition -     Ambulatory referral to Dentistry Dental issues affecting eating, contributing to weight loss. Limited access to care due to Medicaid. - Provide list of Medicaid-accepting dental providers.  Prediabetes -     Hemoglobin A1c -     CMP14+EGFR  Encounter for screening mammogram for malignant neoplasm of breast -     [facility] 3D SCR MAMMO BILAT BR (aka MM); Future  Breast cancer screening by mammogram -     [facility] 3D SCREENING MAMMOGRAM BILATERAL BREAST; Future  Weight loss, unintentional -     Thyroid  Panel With TSH Weight loss since Nov 07, 2023, likely due to stress and dental issues. Differential includes thyroid  disorder and anemia. - Order blood work for thyroid  function and anemia. - Recommend colonoscopy if anemia is present.   Patient has been counseled on age-appropriate routine health concerns for screening and prevention. These are reviewed and up-to-date. Referrals have been placed accordingly. Immunizations are up-to-date or declined.    Subjective:   Chief Complaint  Patient presents with   Hypertension   HPI [name] is a 53 year old female who presents for follow-up to hypertension and with concerns of weight loss and her dental health.  She has experienced significant weight loss since Nov 07, 2023, following the death of her daughter on 10-24-23. She currently weighs 119 pounds. She and her mother have discussed grief, stress, and dental issues as possible causes. Her mother believes that dental issues may also be contributing to her inability to regain weight. Wt Readings from Last 3 Encounters:  03/26/24 119 lb 12.8 oz (54.3 kg)  06/08/23 127 lb (57.6 kg)  11/06/22 130 lb (59 kg)     Her dental health is poor, with teeth in 'really bad shape,'  affecting her ability to eat certain foods, such as steak. She is considering dental implants but has faced challenges finding a dentist who accepts Medicaid.  She is concerned about a mass on her rib cage that 'comes and goes.' A previous doctor suggested it might be a lipoma or fatty tissue, but she remains worried about its nature.  She uses natural olive oil in her cooking and occasionally takes a teaspoon of it orally. She recently purchased a new blood pressure monitor and review of her readings today show that BP is in control. 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 Negative for fever and malaise/fatigue.  HENT: Negative.  Negative for nosebleeds.        See HPI  Eyes: Negative.  Negative for blurred vision, double vision and photophobia.  Respiratory: Negative.  Negative for cough and shortness of breath.   Cardiovascular: Negative.  Negative for chest pain, palpitations and leg swelling.  Gastrointestinal: Negative.  Negative for heartburn, nausea and vomiting.  Musculoskeletal: Negative.  Negative for myalgias.  Neurological: Negative.  Negative for dizziness, focal weakness, seizures and headaches.  Psychiatric/Behavioral: Negative.  Negative for suicidal ideas.     Past Medical History:  Diagnosis Date   Hypertension     Past Surgical History:  Procedure Laterality Date  CESAREAN SECTION     TUBAL LIGATION      Family History  Problem Relation Age of Onset   Hypertension Mother    Diabetes Mellitus II Mother    Hypertension Father    Diabetes Mellitus II Father    Hypertension Sister    Lung cancer Other     Social History Reviewed with no changes to be made today.   Outpatient Medications Prior to Visit  Medication Sig Dispense Refill   cetirizine  (ZYRTEC  ALLERGY) 10 MG  tablet Take 1 tablet (10 mg total) by mouth daily. 90 tablet 1   ibuprofen  (ADVIL ) 600 MG tablet Take 1 tablet (600 mg total) by mouth every 6 (six) hours as needed. 30 tablet 0   losartan -hydrochlorothiazide  (HYZAAR ) 100-25 MG tablet TAKE 1 TABLET BY MOUTH DAILY. MUST HAVE OFFICE VISIT FOR REFILLS 90 tablet 1   tiZANidine  (ZANAFLEX ) 4 MG tablet Take 1 tablet (4 mg total) by mouth every 6 (six) hours as needed for muscle spasms. 30 tablet 0   No facility-administered medications prior to visit.    No Known Allergies     Objective:    BP 108/68 (BP Location: Left Arm, Patient Position: Sitting, Cuff Size: Normal)   Pulse 62   Resp 18   Ht 5' 6.5 (1.689 m)   Wt 119 lb 12.8 oz (54.3 kg)   LMP 08/07/2017 (Exact Date)   SpO2 99%   BMI 19.05 kg/m  Wt Readings from Last 3 Encounters:  03/26/24 119 lb 12.8 oz (54.3 kg)  06/08/23 127 lb (57.6 kg)  11/06/22 130 lb (59 kg)    Physical Exam Vitals and nursing note reviewed.  Constitutional:      Appearance: She is well-developed.  HENT:     Head: Normocephalic and atraumatic.  Cardiovascular:     Rate and Rhythm: Normal rate and regular rhythm.     Heart sounds: Normal heart sounds. No murmur heard.    No friction rub. No gallop.  Pulmonary:     Effort: Pulmonary effort is normal. No tachypnea or respiratory distress.     Breath sounds: Normal breath sounds. No decreased breath sounds, wheezing, rhonchi or rales.  Chest:     Chest wall: No tenderness.  Abdominal:     General: Bowel sounds are normal.     Palpations: Abdomen is soft.  Musculoskeletal:        General: Normal range of motion.     Cervical back: Normal range of motion.  Skin:    General: Skin is warm and dry.  Neurological:     Mental Status: She is alert and oriented to person, place, and time.     Coordination: Coordination normal.  Psychiatric:        Behavior: Behavior normal. Behavior is cooperative.        Thought Content: Thought content normal.         Judgment: Judgment normal.          Patient has been counseled extensively about nutrition and exercise as well as the importance of adherence with medications and regular follow-up. The patient was given clear instructions to go to ER or return to medical center if symptoms don't improve, worsen or new problems develop. The patient verbalized understanding.   Follow-up: Return in about 6 months (around 09/24/2024).   Melissa LELON Servant, FNP-BC [facility] [place], [place] [phone]   03/26/2024, 4:54 PM

## 2024-03-27 ENCOUNTER — Ambulatory Visit: Payer: Self-pay | Admitting: Nurse Practitioner

## 2024-03-27 LAB — CMP14+EGFR
ALT: 18 IU/L (ref 0–32)
AST: 23 IU/L (ref 0–40)
Albumin: 4.4 g/dL (ref 3.8–4.9)
Alkaline Phosphatase: 57 IU/L (ref 49–135)
BUN/Creatinine Ratio: 23 (ref 9–23)
BUN: 27 mg/dL — ABNORMAL HIGH (ref 6–24)
Bilirubin Total: 0.5 mg/dL (ref 0.0–1.2)
CO2: 26 mmol/L (ref 20–29)
Calcium: 10 mg/dL (ref 8.7–10.2)
Chloride: 99 mmol/L (ref 96–106)
Creatinine, Ser: 1.19 mg/dL — ABNORMAL HIGH (ref 0.57–1.00)
Globulin, Total: 3.2 g/dL (ref 1.5–4.5)
Glucose: 98 mg/dL (ref 70–99)
Potassium: 3.5 mmol/L (ref 3.5–5.2)
Sodium: 140 mmol/L (ref 134–144)
Total Protein: 7.6 g/dL (ref 6.0–8.5)
eGFR: 54 mL/min/1.73 — ABNORMAL LOW (ref >59)

## 2024-03-27 LAB — THYROID PANEL WITH TSH
Free Thyroxine Index: 2.7 (ref 1.2–4.9)
T3 Uptake Ratio: 33 % (ref 24–39)
T4, Total: 8.3 ug/dL (ref 4.5–12.0)
TSH: 1.24 u[IU]/mL (ref 0.450–4.500)

## 2024-03-27 LAB — HEMOGLOBIN A1C
Est. average glucose Bld gHb Est-mCnc: 126 mg/dL
Hgb A1c MFr Bld: 6 % — ABNORMAL HIGH (ref 4.8–5.6)

## 2024-04-07 ENCOUNTER — Encounter: Payer: Self-pay | Admitting: Radiology

## 2024-04-16 ENCOUNTER — Ambulatory Visit

## 2024-04-30 ENCOUNTER — Ambulatory Visit
Admission: RE | Admit: 2024-04-30 | Discharge: 2024-04-30 | Disposition: A | Source: Ambulatory Visit | Attending: Nurse Practitioner

## 2024-04-30 DIAGNOSIS — Z1231 Encounter for screening mammogram for malignant neoplasm of breast: Secondary | ICD-10-CM

## 2024-05-06 ENCOUNTER — Encounter: Payer: Self-pay | Admitting: Nurse Practitioner

## 2024-05-15 ENCOUNTER — Ambulatory Visit: Payer: Self-pay

## 2024-05-15 NOTE — Telephone Encounter (Signed)
 Appointment with Dr. Vicci 12/12.

## 2024-05-15 NOTE — Telephone Encounter (Signed)
 Patient call was dropped. Prior nurses triage not finished. Did my own triage in another encounter.

## 2024-05-15 NOTE — Telephone Encounter (Signed)
 FYI Only or Action Required?: FYI only for provider: appointment scheduled on 05/16/2024.  Patient was last seen in primary care on 03/26/2024 by Theotis Haze ORN, NP.  Called Nurse Triage reporting No chief complaint on file..  Symptoms began several months ago.  Interventions attempted: OTC medications: tylenol  arthritis.  Symptoms are: unchanged.  Triage Disposition: See PCP When Office is Open (Within 3 Days)  Patient/caregiver understands and will follow disposition?:   Reason for Disposition  [1] Weakness or numbness in hand or fingers AND [2] present > 2 weeks  Answer Assessment - Initial Assessment Questions Tylenol  for arthitis and is only helping a little bit. Tried Tumeric and honey as well. 1. ONSET: When did the pain start?     Months, got worse this last month.  2. LOCATION: Where is the pain located?     Both hands 3. PAIN: How bad is the pain? (Scale 1-10; or mild, moderate, severe)     Past 10 4. WORK OR EXERCISE: Has there been any recent work or exercise that involved this part (i.e., hand or wrist) of the body?     Works at [employer] and that may exacerbate symptoms. 5. CAUSE: What do you think is causing the pain?     Was told she has arthiritis but this is a newer, worse pain 6. AGGRAVATING FACTORS: What makes the pain worse? (e.g., using computer)     unsure 7. OTHER SYMPTOMS: Do you have any other symptoms? (e.g., fever, neck pain, numbness or tingling, rash, swelling)     Numbness on all 4 fingers, swollen in the morning.  Protocols used: Hand Pain-A-AH

## 2024-05-15 NOTE — Telephone Encounter (Signed)
 Duplicate

## 2024-05-15 NOTE — Telephone Encounter (Signed)
 Copied from CRM #[id number]. Topic: Clinical - Red Word Triage >> May 15, 2024  3:58 PM Tiffini S wrote: [facility] that prompted transfer to Nurse Triage: numbness in hands/ nerves and pains- very painful Answer Assessment - Initial Assessment Questions Patient reports PCP sent to a doctor and was told has arthritis. Patient stopped responding in the middle of triage, waited on the line for patient to return, heard talking in the background, patient never returned. Attempted call back, unable to leave message.  1. ONSET: When did the pain start?     *No Answer* 2. LOCATION: Where is the pain located?     Both hands  3. PAIN: How bad is the pain? (Scale 1-10; or mild, moderate, severe)     Severe, wakes up in the night.   4. WORK OR EXERCISE: Has there been any recent work or exercise that involved this part (i.e., hand or wrist) of the body?     *No Answer* 5. CAUSE: What do you think is causing the pain?     Unsure  6. AGGRAVATING FACTORS: What makes the pain worse? (e.g., using computer)     *No Answer* 7. OTHER SYMPTOMS: Do you have any other symptoms? (e.g., fever, neck pain, numbness or tingling, rash, swelling)     *No Answer*  Protocols used: Hand Pain-A-AH

## 2024-05-16 ENCOUNTER — Ambulatory Visit: Attending: Internal Medicine | Admitting: Internal Medicine

## 2024-05-16 VITALS — BP 163/88 | HR 67 | Ht 66.5 in | Wt 114.4 lb

## 2024-05-16 DIAGNOSIS — M19041 Primary osteoarthritis, right hand: Secondary | ICD-10-CM | POA: Diagnosis not present

## 2024-05-16 DIAGNOSIS — G5603 Carpal tunnel syndrome, bilateral upper limbs: Secondary | ICD-10-CM | POA: Diagnosis not present

## 2024-05-16 DIAGNOSIS — I10 Essential (primary) hypertension: Secondary | ICD-10-CM

## 2024-05-16 DIAGNOSIS — M19042 Primary osteoarthritis, left hand: Secondary | ICD-10-CM | POA: Diagnosis not present

## 2024-05-16 MED ORDER — MELOXICAM 15 MG PO TABS: 15.0000 mg | ORAL_TABLET | Freq: Every day | ORAL | 0 refills | Status: AC
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
## 2024-05-16 NOTE — Patient Instructions (Signed)
°  VISIT SUMMARY: During today's visit, we discussed your worsening hand pain and numbness, as well as your elevated blood pressure reading. We reviewed your symptoms and history, and I provided recommendations and prescriptions to help manage your conditions.  YOUR PLAN: -PRIMARY OSTEOARTHRITIS OF BOTH HANDS: Osteoarthritis is a condition where the protective cartilage that cushions the ends of your bones wears down over time, causing pain and swelling. I have prescribed meloxicam  for you to take daily to help reduce inflammation and manage your pain.  -BILATERAL CARPAL TUNNEL SYNDROME: Carpal tunnel syndrome is a condition caused by pressure on the median nerve in your wrist, leading to numbness and tingling in your fingers. I recommend wearing a cock-up wrist splint at night to keep your wrist straight and reduce nerve compression. If you don't have a wrist splint, I have provided a prescription for one.  -PRIMARY HYPERTENSION: Hypertension, or high blood pressure, is when the force of your blood against your artery walls is too high, which can lead to health problems. Your blood pressure was elevated today, but your home readings are usually normal. Please recheck your blood pressure at home today and continue with your current medication. If your blood pressure remains high, follow up with your primary care provider for a possible medication adjustment.  INSTRUCTIONS: Please recheck your blood pressure at home today. If it remains elevated, follow up with your primary care provider for a potential medication adjustment. Continue taking meloxicam  daily for your osteoarthritis and wear the wrist splint at night for carpal tunnel syndrome. If you need a wrist splint, use the prescription provided.                      Contains text generated by [employer]bridge.                                 Contains text generated by Abridge.

## 2024-05-18 ENCOUNTER — Encounter: Payer: Self-pay | Admitting: Nurse Practitioner

## 2024-05-30 ENCOUNTER — Encounter: Payer: Self-pay | Admitting: Emergency Medicine

## 2024-05-30 ENCOUNTER — Ambulatory Visit: Admission: EM | Admit: 2024-05-30 | Discharge: 2024-05-30 | Disposition: A | Discharge status: Home / Self Care

## 2024-05-30 DIAGNOSIS — K047 Periapical abscess without sinus: Secondary | ICD-10-CM

## 2024-05-30 MED ORDER — AMOXICILLIN 875 MG PO TABS: 875.0000 mg | ORAL_TABLET | Freq: Two times a day (BID) | ORAL | 0 refills | Status: AC

## 2024-05-30 NOTE — ED Triage Notes (Signed)
 Pt presents c/o abscess x 2 days. Pt states,  I have an abscess in my mouth. Now my face is swollen and the pain is unbearable. I can't' eat. I haven't slept. I done been up here for the same problem several times.
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
## 2024-05-30 NOTE — Discharge Instructions (Addendum)
Follow up with dentist as soon as possible.

## 2024-06-10 ENCOUNTER — Other Ambulatory Visit: Payer: Self-pay | Admitting: Nurse Practitioner

## 2024-06-10 DIAGNOSIS — I10 Essential (primary) hypertension: Secondary | ICD-10-CM

## 2024-09-24 ENCOUNTER — Ambulatory Visit: Admitting: Nurse Practitioner
